# Patient Record
Sex: Female | Born: 1945 | Race: Black or African American | Hispanic: No | Marital: Married | Smoking: Never smoker
Health system: Southern US, Community
[De-identification: ages and names within clinical notes are randomized; demographics above are authoritative.]

## PROBLEM LIST (undated history)

## (undated) DIAGNOSIS — K219 Gastro-esophageal reflux disease without esophagitis: Secondary | ICD-10-CM

## (undated) DIAGNOSIS — E785 Hyperlipidemia, unspecified: Secondary | ICD-10-CM

## (undated) DIAGNOSIS — M1990 Unspecified osteoarthritis, unspecified site: Secondary | ICD-10-CM

## (undated) DIAGNOSIS — F32A Depression, unspecified: Secondary | ICD-10-CM

## (undated) DIAGNOSIS — N189 Chronic kidney disease, unspecified: Secondary | ICD-10-CM

## (undated) DIAGNOSIS — I10 Essential (primary) hypertension: Secondary | ICD-10-CM

## (undated) DIAGNOSIS — H9190 Unspecified hearing loss, unspecified ear: Secondary | ICD-10-CM

## (undated) DIAGNOSIS — H1130 Conjunctival hemorrhage, unspecified eye: Secondary | ICD-10-CM

## (undated) DIAGNOSIS — F419 Anxiety disorder, unspecified: Secondary | ICD-10-CM

## (undated) HISTORY — DX: Unspecified osteoarthritis, unspecified site: M19.90

## (undated) HISTORY — DX: Depression, unspecified: F32.A

## (undated) HISTORY — DX: Hyperlipidemia, unspecified: E78.5

## (undated) HISTORY — PX: ABDOMINAL HYSTERECTOMY: SHX81

## (undated) HISTORY — DX: Chronic kidney disease, unspecified: N18.9

## (undated) HISTORY — DX: Anxiety disorder, unspecified: F41.9

## (undated) HISTORY — DX: Gastro-esophageal reflux disease without esophagitis: K21.9

## (undated) HISTORY — DX: Essential (primary) hypertension: I10

## (undated) HISTORY — DX: Conjunctival hemorrhage, unspecified eye: H11.30

---

## 2005-05-19 ENCOUNTER — Ambulatory Visit: Payer: Self-pay | Admitting: Cardiology

## 2005-05-26 ENCOUNTER — Ambulatory Visit: Payer: Self-pay | Admitting: Cardiology

## 2012-06-03 DIAGNOSIS — R079 Chest pain, unspecified: Secondary | ICD-10-CM

## 2016-01-18 DIAGNOSIS — M542 Cervicalgia: Secondary | ICD-10-CM | POA: Diagnosis not present

## 2016-01-18 DIAGNOSIS — I10 Essential (primary) hypertension: Secondary | ICD-10-CM | POA: Diagnosis not present

## 2016-01-18 DIAGNOSIS — M1990 Unspecified osteoarthritis, unspecified site: Secondary | ICD-10-CM | POA: Diagnosis not present

## 2016-01-18 DIAGNOSIS — J069 Acute upper respiratory infection, unspecified: Secondary | ICD-10-CM | POA: Diagnosis not present

## 2016-01-18 DIAGNOSIS — Z299 Encounter for prophylactic measures, unspecified: Secondary | ICD-10-CM | POA: Diagnosis not present

## 2016-01-18 DIAGNOSIS — Z6826 Body mass index (BMI) 26.0-26.9, adult: Secondary | ICD-10-CM | POA: Diagnosis not present

## 2016-02-19 DIAGNOSIS — M159 Polyosteoarthritis, unspecified: Secondary | ICD-10-CM | POA: Diagnosis not present

## 2016-02-19 DIAGNOSIS — E7800 Pure hypercholesterolemia, unspecified: Secondary | ICD-10-CM | POA: Diagnosis not present

## 2016-02-19 DIAGNOSIS — I10 Essential (primary) hypertension: Secondary | ICD-10-CM | POA: Diagnosis not present

## 2016-03-24 DIAGNOSIS — I10 Essential (primary) hypertension: Secondary | ICD-10-CM | POA: Diagnosis not present

## 2016-03-24 DIAGNOSIS — M159 Polyosteoarthritis, unspecified: Secondary | ICD-10-CM | POA: Diagnosis not present

## 2016-03-24 DIAGNOSIS — E7800 Pure hypercholesterolemia, unspecified: Secondary | ICD-10-CM | POA: Diagnosis not present

## 2016-07-08 DIAGNOSIS — E663 Overweight: Secondary | ICD-10-CM | POA: Diagnosis not present

## 2016-07-08 DIAGNOSIS — Z299 Encounter for prophylactic measures, unspecified: Secondary | ICD-10-CM | POA: Diagnosis not present

## 2016-07-08 DIAGNOSIS — Z1389 Encounter for screening for other disorder: Secondary | ICD-10-CM | POA: Diagnosis not present

## 2016-07-08 DIAGNOSIS — Z1211 Encounter for screening for malignant neoplasm of colon: Secondary | ICD-10-CM | POA: Diagnosis not present

## 2016-07-08 DIAGNOSIS — Z7189 Other specified counseling: Secondary | ICD-10-CM | POA: Diagnosis not present

## 2016-07-08 DIAGNOSIS — Z0000 Encounter for general adult medical examination without abnormal findings: Secondary | ICD-10-CM | POA: Diagnosis not present

## 2016-07-10 DIAGNOSIS — Z79899 Other long term (current) drug therapy: Secondary | ICD-10-CM | POA: Diagnosis not present

## 2016-07-10 DIAGNOSIS — E7800 Pure hypercholesterolemia, unspecified: Secondary | ICD-10-CM | POA: Diagnosis not present

## 2016-07-10 DIAGNOSIS — R5383 Other fatigue: Secondary | ICD-10-CM | POA: Diagnosis not present

## 2016-07-24 DIAGNOSIS — I10 Essential (primary) hypertension: Secondary | ICD-10-CM | POA: Diagnosis not present

## 2016-07-24 DIAGNOSIS — E7800 Pure hypercholesterolemia, unspecified: Secondary | ICD-10-CM | POA: Diagnosis not present

## 2016-07-24 DIAGNOSIS — M159 Polyosteoarthritis, unspecified: Secondary | ICD-10-CM | POA: Diagnosis not present

## 2016-07-29 DIAGNOSIS — Z1231 Encounter for screening mammogram for malignant neoplasm of breast: Secondary | ICD-10-CM | POA: Diagnosis not present

## 2016-08-15 DIAGNOSIS — M159 Polyosteoarthritis, unspecified: Secondary | ICD-10-CM | POA: Diagnosis not present

## 2016-08-15 DIAGNOSIS — E7800 Pure hypercholesterolemia, unspecified: Secondary | ICD-10-CM | POA: Diagnosis not present

## 2016-08-15 DIAGNOSIS — I10 Essential (primary) hypertension: Secondary | ICD-10-CM | POA: Diagnosis not present

## 2016-11-20 DIAGNOSIS — I10 Essential (primary) hypertension: Secondary | ICD-10-CM | POA: Diagnosis not present

## 2016-11-20 DIAGNOSIS — E7800 Pure hypercholesterolemia, unspecified: Secondary | ICD-10-CM | POA: Diagnosis not present

## 2016-11-20 DIAGNOSIS — M159 Polyosteoarthritis, unspecified: Secondary | ICD-10-CM | POA: Diagnosis not present

## 2016-12-24 DIAGNOSIS — I10 Essential (primary) hypertension: Secondary | ICD-10-CM | POA: Diagnosis not present

## 2016-12-24 DIAGNOSIS — M159 Polyosteoarthritis, unspecified: Secondary | ICD-10-CM | POA: Diagnosis not present

## 2016-12-24 DIAGNOSIS — E7800 Pure hypercholesterolemia, unspecified: Secondary | ICD-10-CM | POA: Diagnosis not present

## 2017-01-22 DIAGNOSIS — E7800 Pure hypercholesterolemia, unspecified: Secondary | ICD-10-CM | POA: Diagnosis not present

## 2017-01-22 DIAGNOSIS — I10 Essential (primary) hypertension: Secondary | ICD-10-CM | POA: Diagnosis not present

## 2017-01-22 DIAGNOSIS — M159 Polyosteoarthritis, unspecified: Secondary | ICD-10-CM | POA: Diagnosis not present

## 2017-03-13 DIAGNOSIS — F419 Anxiety disorder, unspecified: Secondary | ICD-10-CM | POA: Diagnosis not present

## 2017-03-13 DIAGNOSIS — Z299 Encounter for prophylactic measures, unspecified: Secondary | ICD-10-CM | POA: Diagnosis not present

## 2017-03-13 DIAGNOSIS — I10 Essential (primary) hypertension: Secondary | ICD-10-CM | POA: Diagnosis not present

## 2017-03-13 DIAGNOSIS — M79605 Pain in left leg: Secondary | ICD-10-CM | POA: Diagnosis not present

## 2017-03-13 DIAGNOSIS — Z6825 Body mass index (BMI) 25.0-25.9, adult: Secondary | ICD-10-CM | POA: Diagnosis not present

## 2017-03-13 DIAGNOSIS — M1710 Unilateral primary osteoarthritis, unspecified knee: Secondary | ICD-10-CM | POA: Diagnosis not present

## 2017-03-13 DIAGNOSIS — E7800 Pure hypercholesterolemia, unspecified: Secondary | ICD-10-CM | POA: Diagnosis not present

## 2017-03-13 DIAGNOSIS — Z713 Dietary counseling and surveillance: Secondary | ICD-10-CM | POA: Diagnosis not present

## 2017-03-31 DIAGNOSIS — M159 Polyosteoarthritis, unspecified: Secondary | ICD-10-CM | POA: Diagnosis not present

## 2017-03-31 DIAGNOSIS — E7800 Pure hypercholesterolemia, unspecified: Secondary | ICD-10-CM | POA: Diagnosis not present

## 2017-03-31 DIAGNOSIS — I10 Essential (primary) hypertension: Secondary | ICD-10-CM | POA: Diagnosis not present

## 2017-06-25 DIAGNOSIS — E7800 Pure hypercholesterolemia, unspecified: Secondary | ICD-10-CM | POA: Diagnosis not present

## 2017-06-25 DIAGNOSIS — M159 Polyosteoarthritis, unspecified: Secondary | ICD-10-CM | POA: Diagnosis not present

## 2017-06-25 DIAGNOSIS — I10 Essential (primary) hypertension: Secondary | ICD-10-CM | POA: Diagnosis not present

## 2017-07-09 DIAGNOSIS — Z79899 Other long term (current) drug therapy: Secondary | ICD-10-CM | POA: Diagnosis not present

## 2017-07-09 DIAGNOSIS — Z7189 Other specified counseling: Secondary | ICD-10-CM | POA: Diagnosis not present

## 2017-07-09 DIAGNOSIS — Z0000 Encounter for general adult medical examination without abnormal findings: Secondary | ICD-10-CM | POA: Diagnosis not present

## 2017-07-09 DIAGNOSIS — Z6825 Body mass index (BMI) 25.0-25.9, adult: Secondary | ICD-10-CM | POA: Diagnosis not present

## 2017-07-09 DIAGNOSIS — Z299 Encounter for prophylactic measures, unspecified: Secondary | ICD-10-CM | POA: Diagnosis not present

## 2017-07-09 DIAGNOSIS — R5383 Other fatigue: Secondary | ICD-10-CM | POA: Diagnosis not present

## 2017-07-09 DIAGNOSIS — Z1211 Encounter for screening for malignant neoplasm of colon: Secondary | ICD-10-CM | POA: Diagnosis not present

## 2017-07-09 DIAGNOSIS — Z789 Other specified health status: Secondary | ICD-10-CM | POA: Diagnosis not present

## 2017-07-09 DIAGNOSIS — Z1331 Encounter for screening for depression: Secondary | ICD-10-CM | POA: Diagnosis not present

## 2017-07-09 DIAGNOSIS — Z1339 Encounter for screening examination for other mental health and behavioral disorders: Secondary | ICD-10-CM | POA: Diagnosis not present

## 2017-07-09 DIAGNOSIS — E7800 Pure hypercholesterolemia, unspecified: Secondary | ICD-10-CM | POA: Diagnosis not present

## 2017-07-09 DIAGNOSIS — E2839 Other primary ovarian failure: Secondary | ICD-10-CM | POA: Diagnosis not present

## 2017-07-21 DIAGNOSIS — E2839 Other primary ovarian failure: Secondary | ICD-10-CM | POA: Diagnosis not present

## 2017-08-11 DIAGNOSIS — E7800 Pure hypercholesterolemia, unspecified: Secondary | ICD-10-CM | POA: Diagnosis not present

## 2017-08-11 DIAGNOSIS — I10 Essential (primary) hypertension: Secondary | ICD-10-CM | POA: Diagnosis not present

## 2017-08-11 DIAGNOSIS — M159 Polyosteoarthritis, unspecified: Secondary | ICD-10-CM | POA: Diagnosis not present

## 2017-09-15 DIAGNOSIS — M159 Polyosteoarthritis, unspecified: Secondary | ICD-10-CM | POA: Diagnosis not present

## 2017-09-15 DIAGNOSIS — I10 Essential (primary) hypertension: Secondary | ICD-10-CM | POA: Diagnosis not present

## 2017-09-15 DIAGNOSIS — E7800 Pure hypercholesterolemia, unspecified: Secondary | ICD-10-CM | POA: Diagnosis not present

## 2017-10-16 DIAGNOSIS — I10 Essential (primary) hypertension: Secondary | ICD-10-CM | POA: Diagnosis not present

## 2017-10-16 DIAGNOSIS — E7800 Pure hypercholesterolemia, unspecified: Secondary | ICD-10-CM | POA: Diagnosis not present

## 2017-10-16 DIAGNOSIS — M159 Polyosteoarthritis, unspecified: Secondary | ICD-10-CM | POA: Diagnosis not present

## 2017-11-25 DIAGNOSIS — I10 Essential (primary) hypertension: Secondary | ICD-10-CM | POA: Diagnosis not present

## 2017-11-25 DIAGNOSIS — E7800 Pure hypercholesterolemia, unspecified: Secondary | ICD-10-CM | POA: Diagnosis not present

## 2017-11-25 DIAGNOSIS — M159 Polyosteoarthritis, unspecified: Secondary | ICD-10-CM | POA: Diagnosis not present

## 2017-12-29 DIAGNOSIS — E7800 Pure hypercholesterolemia, unspecified: Secondary | ICD-10-CM | POA: Diagnosis not present

## 2017-12-29 DIAGNOSIS — I10 Essential (primary) hypertension: Secondary | ICD-10-CM | POA: Diagnosis not present

## 2017-12-29 DIAGNOSIS — M159 Polyosteoarthritis, unspecified: Secondary | ICD-10-CM | POA: Diagnosis not present

## 2018-02-10 DIAGNOSIS — M159 Polyosteoarthritis, unspecified: Secondary | ICD-10-CM | POA: Diagnosis not present

## 2018-02-10 DIAGNOSIS — E7800 Pure hypercholesterolemia, unspecified: Secondary | ICD-10-CM | POA: Diagnosis not present

## 2018-02-10 DIAGNOSIS — I10 Essential (primary) hypertension: Secondary | ICD-10-CM | POA: Diagnosis not present

## 2018-03-10 DIAGNOSIS — I10 Essential (primary) hypertension: Secondary | ICD-10-CM | POA: Diagnosis not present

## 2018-03-10 DIAGNOSIS — Z299 Encounter for prophylactic measures, unspecified: Secondary | ICD-10-CM | POA: Diagnosis not present

## 2018-03-10 DIAGNOSIS — E7800 Pure hypercholesterolemia, unspecified: Secondary | ICD-10-CM | POA: Diagnosis not present

## 2018-03-10 DIAGNOSIS — Z6824 Body mass index (BMI) 24.0-24.9, adult: Secondary | ICD-10-CM | POA: Diagnosis not present

## 2018-03-10 DIAGNOSIS — M79606 Pain in leg, unspecified: Secondary | ICD-10-CM | POA: Diagnosis not present

## 2018-03-16 DIAGNOSIS — E7800 Pure hypercholesterolemia, unspecified: Secondary | ICD-10-CM | POA: Diagnosis not present

## 2018-03-16 DIAGNOSIS — M159 Polyosteoarthritis, unspecified: Secondary | ICD-10-CM | POA: Diagnosis not present

## 2018-03-16 DIAGNOSIS — I10 Essential (primary) hypertension: Secondary | ICD-10-CM | POA: Diagnosis not present

## 2018-04-05 DIAGNOSIS — I10 Essential (primary) hypertension: Secondary | ICD-10-CM | POA: Diagnosis not present

## 2018-04-05 DIAGNOSIS — R079 Chest pain, unspecified: Secondary | ICD-10-CM | POA: Diagnosis not present

## 2018-04-05 DIAGNOSIS — Z6825 Body mass index (BMI) 25.0-25.9, adult: Secondary | ICD-10-CM | POA: Diagnosis not present

## 2018-04-05 DIAGNOSIS — I739 Peripheral vascular disease, unspecified: Secondary | ICD-10-CM | POA: Diagnosis not present

## 2018-04-05 DIAGNOSIS — Z299 Encounter for prophylactic measures, unspecified: Secondary | ICD-10-CM | POA: Diagnosis not present

## 2018-04-05 DIAGNOSIS — E7800 Pure hypercholesterolemia, unspecified: Secondary | ICD-10-CM | POA: Diagnosis not present

## 2018-04-16 DIAGNOSIS — I10 Essential (primary) hypertension: Secondary | ICD-10-CM | POA: Diagnosis not present

## 2018-04-16 DIAGNOSIS — E7800 Pure hypercholesterolemia, unspecified: Secondary | ICD-10-CM | POA: Diagnosis not present

## 2018-04-16 DIAGNOSIS — M159 Polyosteoarthritis, unspecified: Secondary | ICD-10-CM | POA: Diagnosis not present

## 2018-04-19 DIAGNOSIS — R079 Chest pain, unspecified: Secondary | ICD-10-CM | POA: Diagnosis not present

## 2018-04-19 DIAGNOSIS — I70202 Unspecified atherosclerosis of native arteries of extremities, left leg: Secondary | ICD-10-CM | POA: Diagnosis not present

## 2018-04-19 DIAGNOSIS — I70211 Atherosclerosis of native arteries of extremities with intermittent claudication, right leg: Secondary | ICD-10-CM | POA: Diagnosis not present

## 2018-06-03 DIAGNOSIS — E7800 Pure hypercholesterolemia, unspecified: Secondary | ICD-10-CM | POA: Diagnosis not present

## 2018-06-03 DIAGNOSIS — I10 Essential (primary) hypertension: Secondary | ICD-10-CM | POA: Diagnosis not present

## 2018-06-03 DIAGNOSIS — M159 Polyosteoarthritis, unspecified: Secondary | ICD-10-CM | POA: Diagnosis not present

## 2018-06-30 DIAGNOSIS — M159 Polyosteoarthritis, unspecified: Secondary | ICD-10-CM | POA: Diagnosis not present

## 2018-06-30 DIAGNOSIS — E7800 Pure hypercholesterolemia, unspecified: Secondary | ICD-10-CM | POA: Diagnosis not present

## 2018-06-30 DIAGNOSIS — I10 Essential (primary) hypertension: Secondary | ICD-10-CM | POA: Diagnosis not present

## 2018-07-12 DIAGNOSIS — Z1211 Encounter for screening for malignant neoplasm of colon: Secondary | ICD-10-CM | POA: Diagnosis not present

## 2018-07-12 DIAGNOSIS — Z6825 Body mass index (BMI) 25.0-25.9, adult: Secondary | ICD-10-CM | POA: Diagnosis not present

## 2018-07-12 DIAGNOSIS — Z7189 Other specified counseling: Secondary | ICD-10-CM | POA: Diagnosis not present

## 2018-07-12 DIAGNOSIS — I10 Essential (primary) hypertension: Secondary | ICD-10-CM | POA: Diagnosis not present

## 2018-07-12 DIAGNOSIS — Z299 Encounter for prophylactic measures, unspecified: Secondary | ICD-10-CM | POA: Diagnosis not present

## 2018-07-12 DIAGNOSIS — Z1339 Encounter for screening examination for other mental health and behavioral disorders: Secondary | ICD-10-CM | POA: Diagnosis not present

## 2018-07-12 DIAGNOSIS — E7800 Pure hypercholesterolemia, unspecified: Secondary | ICD-10-CM | POA: Diagnosis not present

## 2018-07-12 DIAGNOSIS — Z0000 Encounter for general adult medical examination without abnormal findings: Secondary | ICD-10-CM | POA: Diagnosis not present

## 2018-07-12 DIAGNOSIS — Z1331 Encounter for screening for depression: Secondary | ICD-10-CM | POA: Diagnosis not present

## 2018-07-12 DIAGNOSIS — R5383 Other fatigue: Secondary | ICD-10-CM | POA: Diagnosis not present

## 2018-07-13 DIAGNOSIS — E7800 Pure hypercholesterolemia, unspecified: Secondary | ICD-10-CM | POA: Diagnosis not present

## 2018-07-13 DIAGNOSIS — Z79899 Other long term (current) drug therapy: Secondary | ICD-10-CM | POA: Diagnosis not present

## 2018-07-13 DIAGNOSIS — R5383 Other fatigue: Secondary | ICD-10-CM | POA: Diagnosis not present

## 2018-07-29 DIAGNOSIS — E7800 Pure hypercholesterolemia, unspecified: Secondary | ICD-10-CM | POA: Diagnosis not present

## 2018-07-29 DIAGNOSIS — I10 Essential (primary) hypertension: Secondary | ICD-10-CM | POA: Diagnosis not present

## 2018-07-29 DIAGNOSIS — M159 Polyosteoarthritis, unspecified: Secondary | ICD-10-CM | POA: Diagnosis not present

## 2018-08-27 DIAGNOSIS — H538 Other visual disturbances: Secondary | ICD-10-CM | POA: Diagnosis not present

## 2018-09-07 DIAGNOSIS — M159 Polyosteoarthritis, unspecified: Secondary | ICD-10-CM | POA: Diagnosis not present

## 2018-09-07 DIAGNOSIS — I10 Essential (primary) hypertension: Secondary | ICD-10-CM | POA: Diagnosis not present

## 2018-09-07 DIAGNOSIS — E7800 Pure hypercholesterolemia, unspecified: Secondary | ICD-10-CM | POA: Diagnosis not present

## 2018-10-06 DIAGNOSIS — E7800 Pure hypercholesterolemia, unspecified: Secondary | ICD-10-CM | POA: Diagnosis not present

## 2018-10-06 DIAGNOSIS — M159 Polyosteoarthritis, unspecified: Secondary | ICD-10-CM | POA: Diagnosis not present

## 2018-10-06 DIAGNOSIS — I10 Essential (primary) hypertension: Secondary | ICD-10-CM | POA: Diagnosis not present

## 2018-10-19 DIAGNOSIS — Z713 Dietary counseling and surveillance: Secondary | ICD-10-CM | POA: Diagnosis not present

## 2018-10-19 DIAGNOSIS — Z299 Encounter for prophylactic measures, unspecified: Secondary | ICD-10-CM | POA: Diagnosis not present

## 2018-10-19 DIAGNOSIS — Z87891 Personal history of nicotine dependence: Secondary | ICD-10-CM | POA: Diagnosis not present

## 2018-10-19 DIAGNOSIS — I10 Essential (primary) hypertension: Secondary | ICD-10-CM | POA: Diagnosis not present

## 2018-10-19 DIAGNOSIS — J209 Acute bronchitis, unspecified: Secondary | ICD-10-CM | POA: Diagnosis not present

## 2018-11-11 DIAGNOSIS — E7800 Pure hypercholesterolemia, unspecified: Secondary | ICD-10-CM | POA: Diagnosis not present

## 2018-11-11 DIAGNOSIS — I10 Essential (primary) hypertension: Secondary | ICD-10-CM | POA: Diagnosis not present

## 2018-11-11 DIAGNOSIS — M159 Polyosteoarthritis, unspecified: Secondary | ICD-10-CM | POA: Diagnosis not present

## 2018-12-13 DIAGNOSIS — I10 Essential (primary) hypertension: Secondary | ICD-10-CM | POA: Diagnosis not present

## 2018-12-13 DIAGNOSIS — M159 Polyosteoarthritis, unspecified: Secondary | ICD-10-CM | POA: Diagnosis not present

## 2018-12-13 DIAGNOSIS — E7800 Pure hypercholesterolemia, unspecified: Secondary | ICD-10-CM | POA: Diagnosis not present

## 2019-01-18 DIAGNOSIS — I739 Peripheral vascular disease, unspecified: Secondary | ICD-10-CM | POA: Diagnosis not present

## 2019-01-18 DIAGNOSIS — I10 Essential (primary) hypertension: Secondary | ICD-10-CM | POA: Diagnosis not present

## 2019-01-18 DIAGNOSIS — Z299 Encounter for prophylactic measures, unspecified: Secondary | ICD-10-CM | POA: Diagnosis not present

## 2019-01-18 DIAGNOSIS — Z713 Dietary counseling and surveillance: Secondary | ICD-10-CM | POA: Diagnosis not present

## 2019-01-18 DIAGNOSIS — Z6825 Body mass index (BMI) 25.0-25.9, adult: Secondary | ICD-10-CM | POA: Diagnosis not present

## 2019-01-25 DIAGNOSIS — I10 Essential (primary) hypertension: Secondary | ICD-10-CM | POA: Diagnosis not present

## 2019-01-25 DIAGNOSIS — R05 Cough: Secondary | ICD-10-CM | POA: Diagnosis not present

## 2019-01-25 DIAGNOSIS — Z299 Encounter for prophylactic measures, unspecified: Secondary | ICD-10-CM | POA: Diagnosis not present

## 2019-01-25 DIAGNOSIS — I739 Peripheral vascular disease, unspecified: Secondary | ICD-10-CM | POA: Diagnosis not present

## 2019-01-25 DIAGNOSIS — Z6825 Body mass index (BMI) 25.0-25.9, adult: Secondary | ICD-10-CM | POA: Diagnosis not present

## 2019-03-08 DIAGNOSIS — M159 Polyosteoarthritis, unspecified: Secondary | ICD-10-CM | POA: Diagnosis not present

## 2019-03-08 DIAGNOSIS — E7800 Pure hypercholesterolemia, unspecified: Secondary | ICD-10-CM | POA: Diagnosis not present

## 2019-03-08 DIAGNOSIS — I10 Essential (primary) hypertension: Secondary | ICD-10-CM | POA: Diagnosis not present

## 2019-03-15 DIAGNOSIS — E7800 Pure hypercholesterolemia, unspecified: Secondary | ICD-10-CM | POA: Diagnosis not present

## 2019-03-15 DIAGNOSIS — F419 Anxiety disorder, unspecified: Secondary | ICD-10-CM | POA: Diagnosis not present

## 2019-03-15 DIAGNOSIS — F329 Major depressive disorder, single episode, unspecified: Secondary | ICD-10-CM | POA: Diagnosis not present

## 2019-03-15 DIAGNOSIS — Z299 Encounter for prophylactic measures, unspecified: Secondary | ICD-10-CM | POA: Diagnosis not present

## 2019-03-15 DIAGNOSIS — I10 Essential (primary) hypertension: Secondary | ICD-10-CM | POA: Diagnosis not present

## 2019-05-12 DIAGNOSIS — E7800 Pure hypercholesterolemia, unspecified: Secondary | ICD-10-CM | POA: Diagnosis not present

## 2019-05-12 DIAGNOSIS — I10 Essential (primary) hypertension: Secondary | ICD-10-CM | POA: Diagnosis not present

## 2019-05-12 DIAGNOSIS — M159 Polyosteoarthritis, unspecified: Secondary | ICD-10-CM | POA: Diagnosis not present

## 2019-06-23 DIAGNOSIS — R5383 Other fatigue: Secondary | ICD-10-CM | POA: Diagnosis not present

## 2019-06-23 DIAGNOSIS — Z79899 Other long term (current) drug therapy: Secondary | ICD-10-CM | POA: Diagnosis not present

## 2019-06-23 DIAGNOSIS — E7800 Pure hypercholesterolemia, unspecified: Secondary | ICD-10-CM | POA: Diagnosis not present

## 2019-06-24 DIAGNOSIS — M159 Polyosteoarthritis, unspecified: Secondary | ICD-10-CM | POA: Diagnosis not present

## 2019-06-24 DIAGNOSIS — I10 Essential (primary) hypertension: Secondary | ICD-10-CM | POA: Diagnosis not present

## 2019-06-24 DIAGNOSIS — E7800 Pure hypercholesterolemia, unspecified: Secondary | ICD-10-CM | POA: Diagnosis not present

## 2019-07-22 DIAGNOSIS — E7800 Pure hypercholesterolemia, unspecified: Secondary | ICD-10-CM | POA: Diagnosis not present

## 2019-07-22 DIAGNOSIS — I10 Essential (primary) hypertension: Secondary | ICD-10-CM | POA: Diagnosis not present

## 2019-07-22 DIAGNOSIS — M159 Polyosteoarthritis, unspecified: Secondary | ICD-10-CM | POA: Diagnosis not present

## 2019-08-30 DIAGNOSIS — E7800 Pure hypercholesterolemia, unspecified: Secondary | ICD-10-CM | POA: Diagnosis not present

## 2019-08-30 DIAGNOSIS — I10 Essential (primary) hypertension: Secondary | ICD-10-CM | POA: Diagnosis not present

## 2019-08-30 DIAGNOSIS — M159 Polyosteoarthritis, unspecified: Secondary | ICD-10-CM | POA: Diagnosis not present

## 2019-09-13 DIAGNOSIS — Z23 Encounter for immunization: Secondary | ICD-10-CM | POA: Diagnosis not present

## 2019-09-27 DIAGNOSIS — Z299 Encounter for prophylactic measures, unspecified: Secondary | ICD-10-CM | POA: Diagnosis not present

## 2019-09-27 DIAGNOSIS — E7800 Pure hypercholesterolemia, unspecified: Secondary | ICD-10-CM | POA: Diagnosis not present

## 2019-09-27 DIAGNOSIS — I10 Essential (primary) hypertension: Secondary | ICD-10-CM | POA: Diagnosis not present

## 2019-09-27 DIAGNOSIS — M542 Cervicalgia: Secondary | ICD-10-CM | POA: Diagnosis not present

## 2019-10-04 DIAGNOSIS — I10 Essential (primary) hypertension: Secondary | ICD-10-CM | POA: Diagnosis not present

## 2019-10-04 DIAGNOSIS — E7800 Pure hypercholesterolemia, unspecified: Secondary | ICD-10-CM | POA: Diagnosis not present

## 2019-10-04 DIAGNOSIS — M159 Polyosteoarthritis, unspecified: Secondary | ICD-10-CM | POA: Diagnosis not present

## 2019-10-11 DIAGNOSIS — Z23 Encounter for immunization: Secondary | ICD-10-CM | POA: Diagnosis not present

## 2019-11-04 DIAGNOSIS — E7800 Pure hypercholesterolemia, unspecified: Secondary | ICD-10-CM | POA: Diagnosis not present

## 2019-11-04 DIAGNOSIS — I10 Essential (primary) hypertension: Secondary | ICD-10-CM | POA: Diagnosis not present

## 2019-11-04 DIAGNOSIS — M159 Polyosteoarthritis, unspecified: Secondary | ICD-10-CM | POA: Diagnosis not present

## 2019-11-25 DIAGNOSIS — Z299 Encounter for prophylactic measures, unspecified: Secondary | ICD-10-CM | POA: Diagnosis not present

## 2019-11-25 DIAGNOSIS — M542 Cervicalgia: Secondary | ICD-10-CM | POA: Diagnosis not present

## 2019-11-25 DIAGNOSIS — I10 Essential (primary) hypertension: Secondary | ICD-10-CM | POA: Diagnosis not present

## 2019-12-11 DIAGNOSIS — M159 Polyosteoarthritis, unspecified: Secondary | ICD-10-CM | POA: Diagnosis not present

## 2019-12-11 DIAGNOSIS — I10 Essential (primary) hypertension: Secondary | ICD-10-CM | POA: Diagnosis not present

## 2019-12-11 DIAGNOSIS — E7800 Pure hypercholesterolemia, unspecified: Secondary | ICD-10-CM | POA: Diagnosis not present

## 2020-01-11 DIAGNOSIS — E7800 Pure hypercholesterolemia, unspecified: Secondary | ICD-10-CM | POA: Diagnosis not present

## 2020-01-11 DIAGNOSIS — I10 Essential (primary) hypertension: Secondary | ICD-10-CM | POA: Diagnosis not present

## 2020-01-11 DIAGNOSIS — M159 Polyosteoarthritis, unspecified: Secondary | ICD-10-CM | POA: Diagnosis not present

## 2020-03-07 DIAGNOSIS — F419 Anxiety disorder, unspecified: Secondary | ICD-10-CM | POA: Diagnosis not present

## 2020-03-07 DIAGNOSIS — Z299 Encounter for prophylactic measures, unspecified: Secondary | ICD-10-CM | POA: Diagnosis not present

## 2020-03-07 DIAGNOSIS — I10 Essential (primary) hypertension: Secondary | ICD-10-CM | POA: Diagnosis not present

## 2020-04-12 DIAGNOSIS — E7800 Pure hypercholesterolemia, unspecified: Secondary | ICD-10-CM | POA: Diagnosis not present

## 2020-04-12 DIAGNOSIS — I10 Essential (primary) hypertension: Secondary | ICD-10-CM | POA: Diagnosis not present

## 2020-04-12 DIAGNOSIS — M159 Polyosteoarthritis, unspecified: Secondary | ICD-10-CM | POA: Diagnosis not present

## 2020-05-11 DIAGNOSIS — I10 Essential (primary) hypertension: Secondary | ICD-10-CM | POA: Diagnosis not present

## 2020-05-11 DIAGNOSIS — E7800 Pure hypercholesterolemia, unspecified: Secondary | ICD-10-CM | POA: Diagnosis not present

## 2020-05-11 DIAGNOSIS — M159 Polyosteoarthritis, unspecified: Secondary | ICD-10-CM | POA: Diagnosis not present

## 2020-06-12 DIAGNOSIS — M159 Polyosteoarthritis, unspecified: Secondary | ICD-10-CM | POA: Diagnosis not present

## 2020-06-12 DIAGNOSIS — I10 Essential (primary) hypertension: Secondary | ICD-10-CM | POA: Diagnosis not present

## 2020-06-12 DIAGNOSIS — E7800 Pure hypercholesterolemia, unspecified: Secondary | ICD-10-CM | POA: Diagnosis not present

## 2020-07-03 DIAGNOSIS — Z7189 Other specified counseling: Secondary | ICD-10-CM | POA: Diagnosis not present

## 2020-07-03 DIAGNOSIS — R5383 Other fatigue: Secondary | ICD-10-CM | POA: Diagnosis not present

## 2020-07-03 DIAGNOSIS — Z1211 Encounter for screening for malignant neoplasm of colon: Secondary | ICD-10-CM | POA: Diagnosis not present

## 2020-07-03 DIAGNOSIS — Z0000 Encounter for general adult medical examination without abnormal findings: Secondary | ICD-10-CM | POA: Diagnosis not present

## 2020-07-03 DIAGNOSIS — Z1331 Encounter for screening for depression: Secondary | ICD-10-CM | POA: Diagnosis not present

## 2020-07-03 DIAGNOSIS — Z6824 Body mass index (BMI) 24.0-24.9, adult: Secondary | ICD-10-CM | POA: Diagnosis not present

## 2020-07-03 DIAGNOSIS — Z79899 Other long term (current) drug therapy: Secondary | ICD-10-CM | POA: Diagnosis not present

## 2020-07-03 DIAGNOSIS — E7800 Pure hypercholesterolemia, unspecified: Secondary | ICD-10-CM | POA: Diagnosis not present

## 2020-07-03 DIAGNOSIS — Z1339 Encounter for screening examination for other mental health and behavioral disorders: Secondary | ICD-10-CM | POA: Diagnosis not present

## 2020-07-03 DIAGNOSIS — I10 Essential (primary) hypertension: Secondary | ICD-10-CM | POA: Diagnosis not present

## 2020-07-03 DIAGNOSIS — Z299 Encounter for prophylactic measures, unspecified: Secondary | ICD-10-CM | POA: Diagnosis not present

## 2020-07-12 DIAGNOSIS — I10 Essential (primary) hypertension: Secondary | ICD-10-CM | POA: Diagnosis not present

## 2020-07-12 DIAGNOSIS — E7800 Pure hypercholesterolemia, unspecified: Secondary | ICD-10-CM | POA: Diagnosis not present

## 2020-07-12 DIAGNOSIS — M159 Polyosteoarthritis, unspecified: Secondary | ICD-10-CM | POA: Diagnosis not present

## 2020-09-05 DIAGNOSIS — I739 Peripheral vascular disease, unspecified: Secondary | ICD-10-CM | POA: Diagnosis not present

## 2020-09-05 DIAGNOSIS — I10 Essential (primary) hypertension: Secondary | ICD-10-CM | POA: Diagnosis not present

## 2020-09-05 DIAGNOSIS — Z299 Encounter for prophylactic measures, unspecified: Secondary | ICD-10-CM | POA: Diagnosis not present

## 2020-09-05 DIAGNOSIS — K219 Gastro-esophageal reflux disease without esophagitis: Secondary | ICD-10-CM | POA: Diagnosis not present

## 2020-09-05 DIAGNOSIS — D692 Other nonthrombocytopenic purpura: Secondary | ICD-10-CM | POA: Diagnosis not present

## 2020-12-20 DIAGNOSIS — Z299 Encounter for prophylactic measures, unspecified: Secondary | ICD-10-CM | POA: Diagnosis not present

## 2020-12-20 DIAGNOSIS — K219 Gastro-esophageal reflux disease without esophagitis: Secondary | ICD-10-CM | POA: Diagnosis not present

## 2020-12-20 DIAGNOSIS — E7800 Pure hypercholesterolemia, unspecified: Secondary | ICD-10-CM | POA: Diagnosis not present

## 2020-12-20 DIAGNOSIS — Z87891 Personal history of nicotine dependence: Secondary | ICD-10-CM | POA: Diagnosis not present

## 2020-12-20 DIAGNOSIS — I739 Peripheral vascular disease, unspecified: Secondary | ICD-10-CM | POA: Diagnosis not present

## 2020-12-20 DIAGNOSIS — I10 Essential (primary) hypertension: Secondary | ICD-10-CM | POA: Diagnosis not present

## 2021-04-25 DIAGNOSIS — I10 Essential (primary) hypertension: Secondary | ICD-10-CM | POA: Diagnosis not present

## 2021-04-25 DIAGNOSIS — Z6825 Body mass index (BMI) 25.0-25.9, adult: Secondary | ICD-10-CM | POA: Diagnosis not present

## 2021-04-25 DIAGNOSIS — E7800 Pure hypercholesterolemia, unspecified: Secondary | ICD-10-CM | POA: Diagnosis not present

## 2021-04-25 DIAGNOSIS — Z299 Encounter for prophylactic measures, unspecified: Secondary | ICD-10-CM | POA: Diagnosis not present

## 2021-05-15 ENCOUNTER — Other Ambulatory Visit: Payer: Self-pay | Admitting: Internal Medicine

## 2021-05-15 DIAGNOSIS — Z139 Encounter for screening, unspecified: Secondary | ICD-10-CM

## 2021-05-21 ENCOUNTER — Inpatient Hospital Stay: Admission: RE | Admit: 2021-05-21 | Payer: Medicare Other | Source: Ambulatory Visit

## 2021-06-24 ENCOUNTER — Ambulatory Visit
Admission: RE | Admit: 2021-06-24 | Discharge: 2021-06-24 | Disposition: A | Discharge status: Home / Self Care | Payer: Medicare Other | Source: Ambulatory Visit | Attending: Internal Medicine | Admitting: Internal Medicine

## 2021-06-24 DIAGNOSIS — Z1231 Encounter for screening mammogram for malignant neoplasm of breast: Secondary | ICD-10-CM | POA: Diagnosis not present

## 2021-06-24 DIAGNOSIS — Z139 Encounter for screening, unspecified: Secondary | ICD-10-CM

## 2021-06-27 ENCOUNTER — Other Ambulatory Visit: Payer: Self-pay | Admitting: Internal Medicine

## 2021-06-27 DIAGNOSIS — Z1339 Encounter for screening examination for other mental health and behavioral disorders: Secondary | ICD-10-CM | POA: Diagnosis not present

## 2021-06-27 DIAGNOSIS — Z1331 Encounter for screening for depression: Secondary | ICD-10-CM | POA: Diagnosis not present

## 2021-06-27 DIAGNOSIS — Z299 Encounter for prophylactic measures, unspecified: Secondary | ICD-10-CM | POA: Diagnosis not present

## 2021-06-27 DIAGNOSIS — E7800 Pure hypercholesterolemia, unspecified: Secondary | ICD-10-CM | POA: Diagnosis not present

## 2021-06-27 DIAGNOSIS — R5383 Other fatigue: Secondary | ICD-10-CM | POA: Diagnosis not present

## 2021-06-27 DIAGNOSIS — Z6824 Body mass index (BMI) 24.0-24.9, adult: Secondary | ICD-10-CM | POA: Diagnosis not present

## 2021-06-27 DIAGNOSIS — Z87891 Personal history of nicotine dependence: Secondary | ICD-10-CM | POA: Diagnosis not present

## 2021-06-27 DIAGNOSIS — Z0000 Encounter for general adult medical examination without abnormal findings: Secondary | ICD-10-CM | POA: Diagnosis not present

## 2021-06-27 DIAGNOSIS — Z7189 Other specified counseling: Secondary | ICD-10-CM | POA: Diagnosis not present

## 2021-06-27 DIAGNOSIS — Z79899 Other long term (current) drug therapy: Secondary | ICD-10-CM | POA: Diagnosis not present

## 2021-06-27 DIAGNOSIS — R928 Other abnormal and inconclusive findings on diagnostic imaging of breast: Secondary | ICD-10-CM

## 2021-06-27 DIAGNOSIS — I10 Essential (primary) hypertension: Secondary | ICD-10-CM | POA: Diagnosis not present

## 2021-08-28 DIAGNOSIS — R928 Other abnormal and inconclusive findings on diagnostic imaging of breast: Secondary | ICD-10-CM | POA: Diagnosis not present

## 2021-08-28 DIAGNOSIS — R922 Inconclusive mammogram: Secondary | ICD-10-CM | POA: Diagnosis not present

## 2021-09-04 ENCOUNTER — Other Ambulatory Visit: Payer: Self-pay | Admitting: Internal Medicine

## 2021-09-04 DIAGNOSIS — R928 Other abnormal and inconclusive findings on diagnostic imaging of breast: Secondary | ICD-10-CM

## 2021-09-30 ENCOUNTER — Other Ambulatory Visit: Payer: Self-pay | Admitting: Internal Medicine

## 2021-09-30 ENCOUNTER — Ambulatory Visit
Admission: RE | Admit: 2021-09-30 | Discharge: 2021-09-30 | Disposition: A | Discharge status: Home / Self Care | Payer: Medicare Other | Source: Ambulatory Visit | Attending: Internal Medicine | Admitting: Internal Medicine

## 2021-09-30 DIAGNOSIS — R928 Other abnormal and inconclusive findings on diagnostic imaging of breast: Secondary | ICD-10-CM

## 2021-10-01 DIAGNOSIS — E7800 Pure hypercholesterolemia, unspecified: Secondary | ICD-10-CM | POA: Diagnosis not present

## 2021-10-01 DIAGNOSIS — Z299 Encounter for prophylactic measures, unspecified: Secondary | ICD-10-CM | POA: Diagnosis not present

## 2021-10-01 DIAGNOSIS — N1831 Chronic kidney disease, stage 3a: Secondary | ICD-10-CM | POA: Diagnosis not present

## 2021-10-01 DIAGNOSIS — I10 Essential (primary) hypertension: Secondary | ICD-10-CM | POA: Diagnosis not present

## 2021-10-01 DIAGNOSIS — Z79899 Other long term (current) drug therapy: Secondary | ICD-10-CM | POA: Diagnosis not present

## 2021-10-02 DIAGNOSIS — Z79899 Other long term (current) drug therapy: Secondary | ICD-10-CM | POA: Diagnosis not present

## 2021-10-02 DIAGNOSIS — E7800 Pure hypercholesterolemia, unspecified: Secondary | ICD-10-CM | POA: Diagnosis not present

## 2022-01-15 DIAGNOSIS — D692 Other nonthrombocytopenic purpura: Secondary | ICD-10-CM | POA: Diagnosis not present

## 2022-01-15 DIAGNOSIS — I10 Essential (primary) hypertension: Secondary | ICD-10-CM | POA: Diagnosis not present

## 2022-01-15 DIAGNOSIS — E7800 Pure hypercholesterolemia, unspecified: Secondary | ICD-10-CM | POA: Diagnosis not present

## 2022-01-15 DIAGNOSIS — Z299 Encounter for prophylactic measures, unspecified: Secondary | ICD-10-CM | POA: Diagnosis not present

## 2022-02-06 IMAGING — MG MM DIGITAL SCREENING BILAT W/ TOMO AND CAD
6 of 10 series · 6 of 30 positions shown · non-contrast
Comparison: Previous exam(s).

CLINICAL DATA: Screening.

EXAM:
DIGITAL SCREENING BILATERAL MAMMOGRAM WITH TOMOSYNTHESIS AND CAD
TECHNIQUE: Bilateral screening digital craniocaudal and mediolateral oblique
mammograms were obtained. Bilateral screening digital breast
tomosynthesis was performed. The images were evaluated with
computer-aided detection.

[L CC synth-2D (1 of 2)]
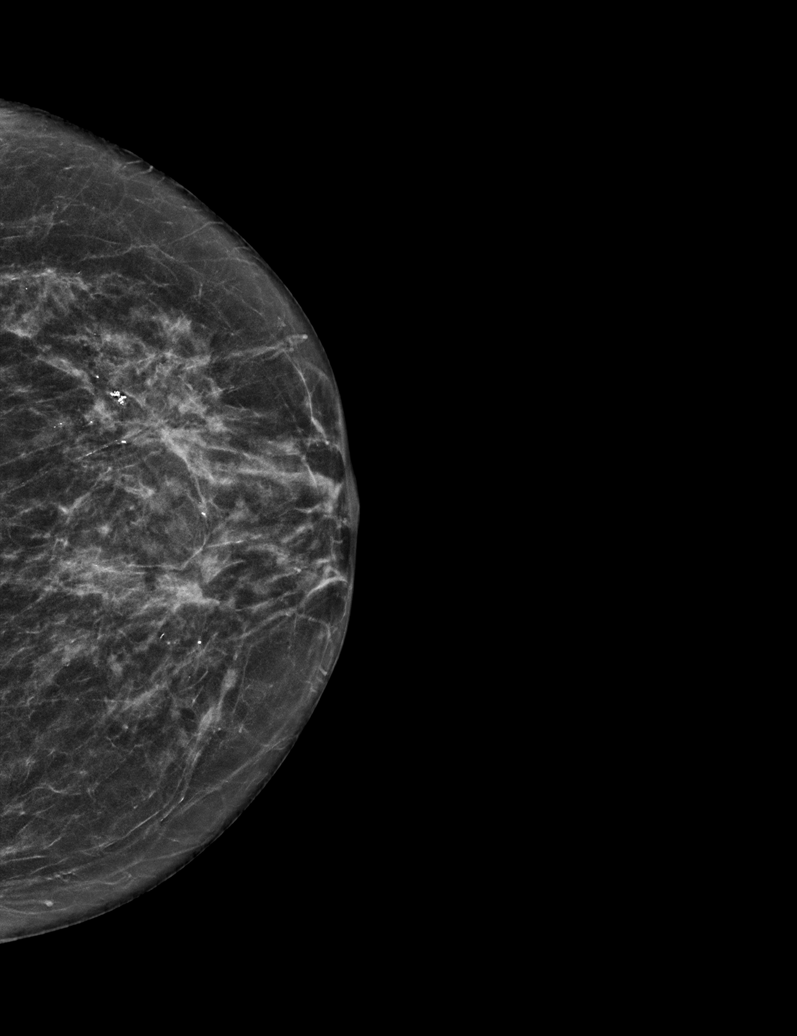

[L MLO synth-2D]
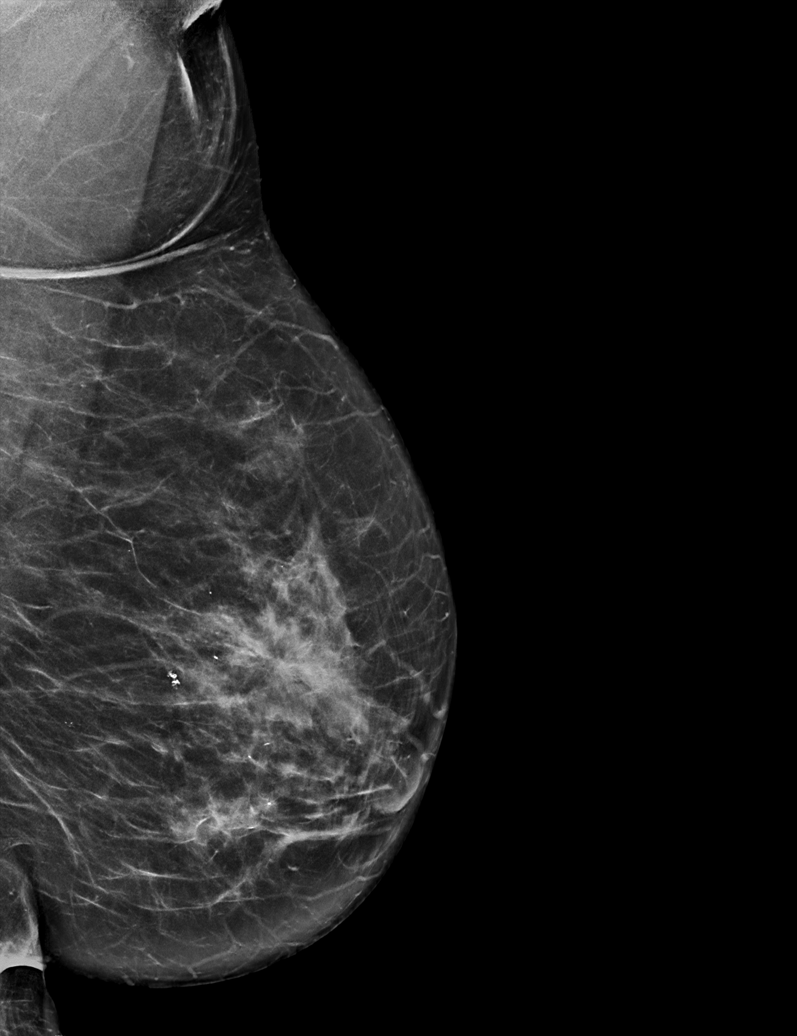

[L CC synth-2D (2 of 2)]
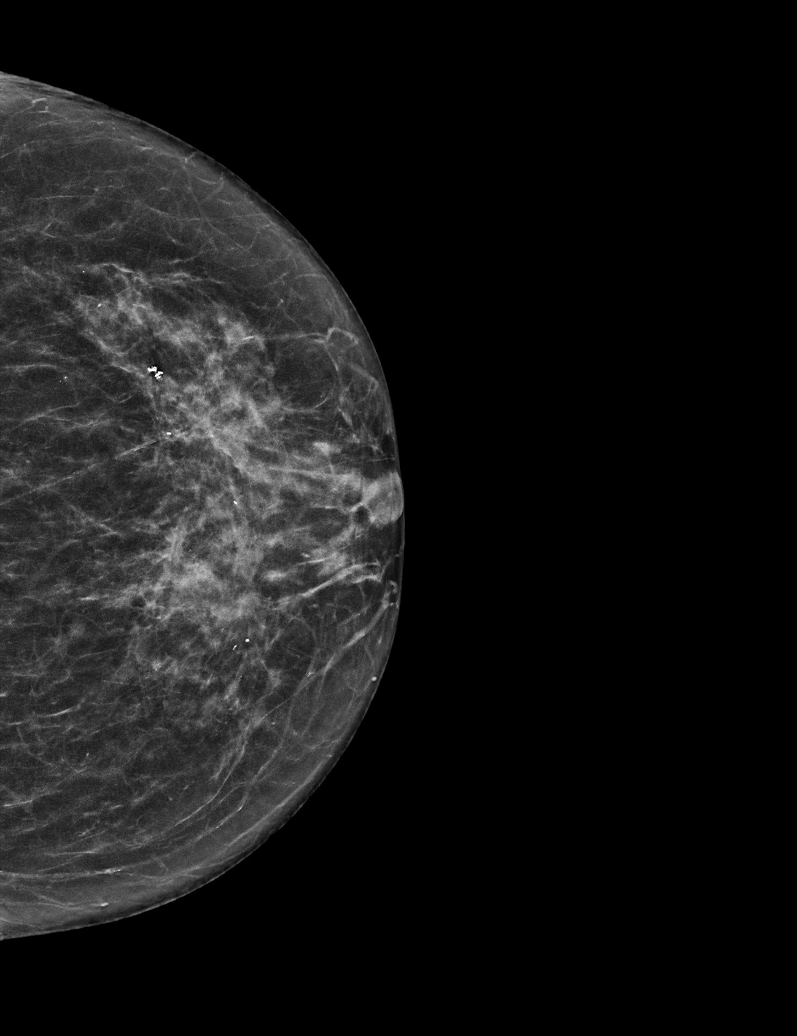

[R CC synth-2D]
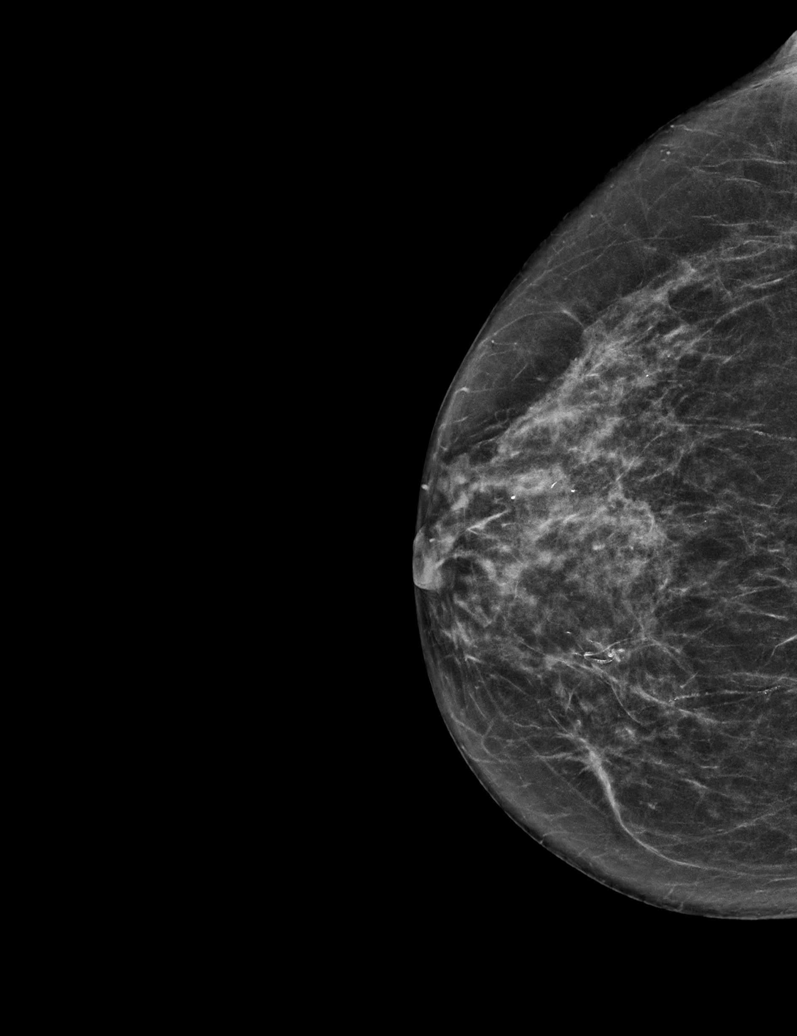

[R MLO synth-2D]
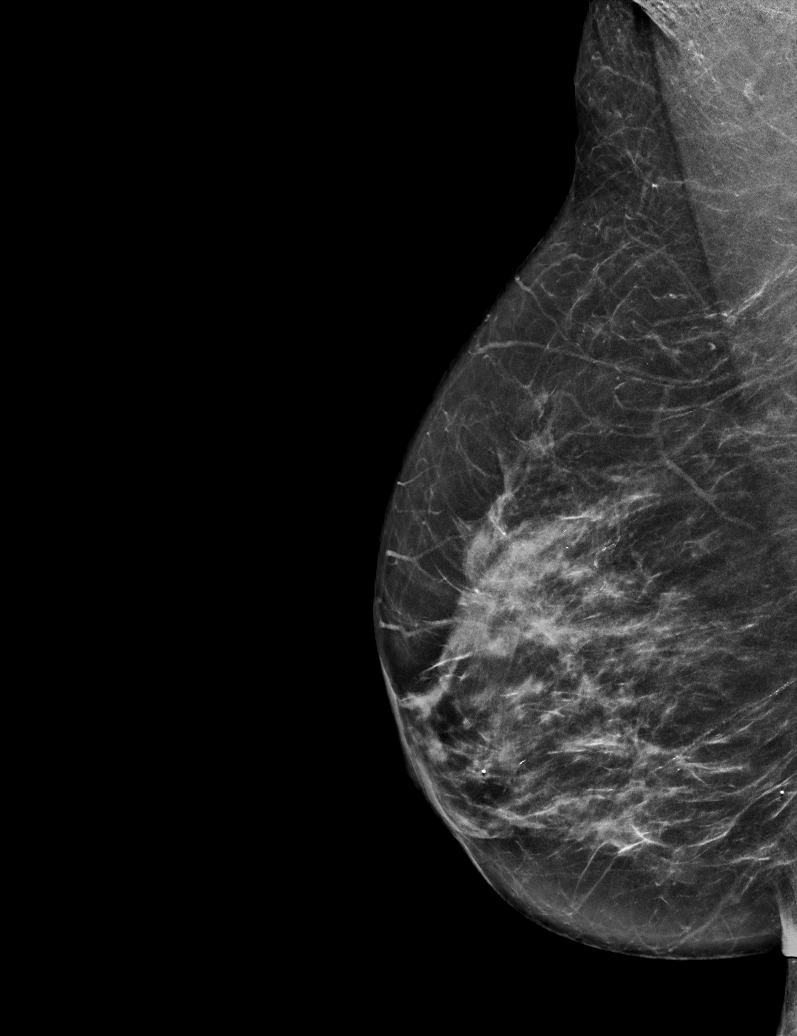

[R MLO tomo · tomo slice 31/61.0]
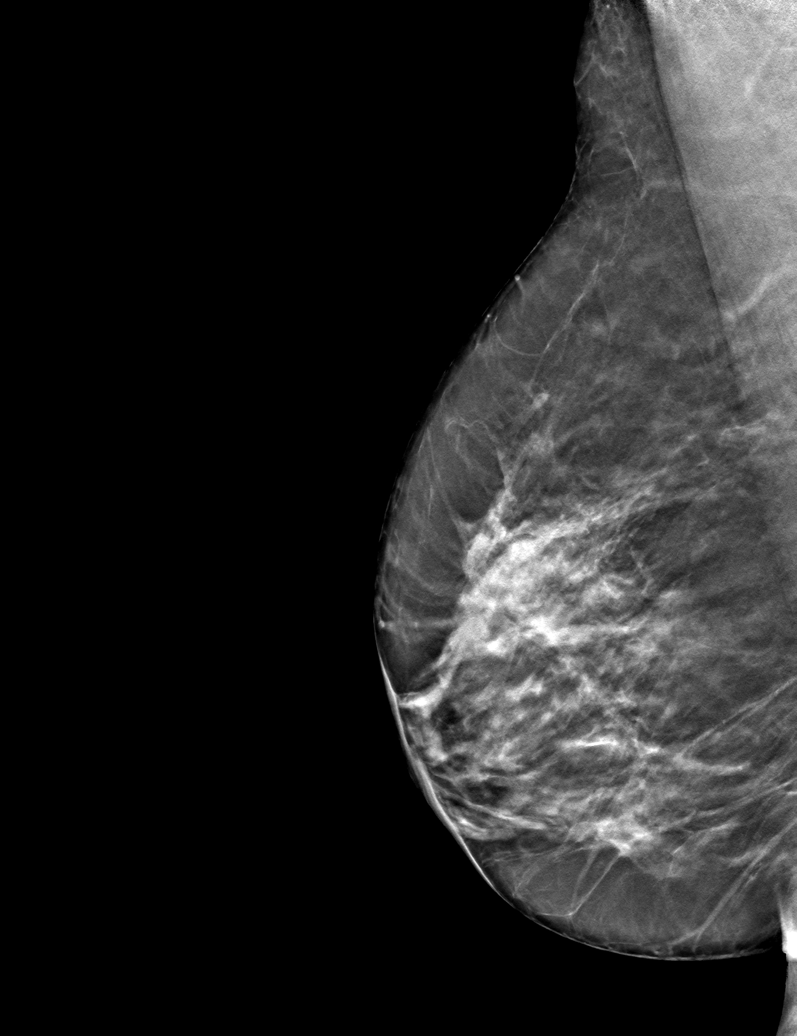

[6 of 30 positions shown; findings below may reference images not displayed]

ACR Breast Density Category c: The breast tissue is heterogeneously
dense, which may obscure small masses.
FINDINGS: In the right breast a possible asymmetry requires further
evaluation.

In the left breast possible asymmetry as well as possible distortion
requires further evaluation.
IMPRESSION: Further evaluation is suggested for possible asymmetry in the right
breast.

Further evaluation is suggested for possible asymmetry as well as
possible distortion in the left breast.

RECOMMENDATION:
Diagnostic mammogram and possibly ultrasound of both breasts.
(Code:[id number]-G-IIH)

The patient will be contacted regarding the findings, and additional
imaging will be scheduled.

BI-RADS CATEGORY  0: Incomplete. Need additional imaging evaluation
and/or prior mammograms for comparison.

## 2022-02-28 DIAGNOSIS — I10 Essential (primary) hypertension: Secondary | ICD-10-CM | POA: Diagnosis not present

## 2022-02-28 DIAGNOSIS — Z789 Other specified health status: Secondary | ICD-10-CM | POA: Diagnosis not present

## 2022-02-28 DIAGNOSIS — Z299 Encounter for prophylactic measures, unspecified: Secondary | ICD-10-CM | POA: Diagnosis not present

## 2022-02-28 DIAGNOSIS — T2220XA Burn of second degree of shoulder and upper limb, except wrist and hand, unspecified site, initial encounter: Secondary | ICD-10-CM | POA: Diagnosis not present

## 2022-03-11 DIAGNOSIS — H25813 Combined forms of age-related cataract, bilateral: Secondary | ICD-10-CM | POA: Diagnosis not present

## 2022-04-09 ENCOUNTER — Other Ambulatory Visit: Payer: Self-pay | Admitting: Internal Medicine

## 2022-04-09 DIAGNOSIS — R928 Other abnormal and inconclusive findings on diagnostic imaging of breast: Secondary | ICD-10-CM | POA: Diagnosis not present

## 2022-04-09 DIAGNOSIS — N6489 Other specified disorders of breast: Secondary | ICD-10-CM | POA: Diagnosis not present

## 2022-04-22 ENCOUNTER — Ambulatory Visit
Admission: RE | Admit: 2022-04-22 | Discharge: 2022-04-22 | Disposition: A | Discharge status: Home / Self Care | Payer: Medicare Other | Source: Ambulatory Visit | Attending: Internal Medicine | Admitting: Internal Medicine

## 2022-04-22 ENCOUNTER — Other Ambulatory Visit: Payer: Self-pay | Admitting: Internal Medicine

## 2022-04-22 DIAGNOSIS — R928 Other abnormal and inconclusive findings on diagnostic imaging of breast: Secondary | ICD-10-CM

## 2022-04-22 DIAGNOSIS — N62 Hypertrophy of breast: Secondary | ICD-10-CM | POA: Diagnosis not present

## 2022-04-22 DIAGNOSIS — D0512 Intraductal carcinoma in situ of left breast: Secondary | ICD-10-CM | POA: Diagnosis not present

## 2022-05-02 DIAGNOSIS — I10 Essential (primary) hypertension: Secondary | ICD-10-CM | POA: Diagnosis not present

## 2022-05-02 DIAGNOSIS — C801 Malignant (primary) neoplasm, unspecified: Secondary | ICD-10-CM | POA: Diagnosis not present

## 2022-05-02 DIAGNOSIS — Z299 Encounter for prophylactic measures, unspecified: Secondary | ICD-10-CM | POA: Diagnosis not present

## 2022-05-12 DIAGNOSIS — Z299 Encounter for prophylactic measures, unspecified: Secondary | ICD-10-CM | POA: Diagnosis not present

## 2022-05-12 DIAGNOSIS — I10 Essential (primary) hypertension: Secondary | ICD-10-CM | POA: Diagnosis not present

## 2022-05-12 DIAGNOSIS — C801 Malignant (primary) neoplasm, unspecified: Secondary | ICD-10-CM | POA: Diagnosis not present

## 2022-05-21 ENCOUNTER — Encounter: Payer: Self-pay | Admitting: General Surgery

## 2022-05-21 ENCOUNTER — Other Ambulatory Visit: Payer: Self-pay | Admitting: General Surgery

## 2022-05-21 ENCOUNTER — Ambulatory Visit (INDEPENDENT_AMBULATORY_CARE_PROVIDER_SITE_OTHER): Payer: Medicare Other | Admitting: General Surgery

## 2022-05-21 ENCOUNTER — Other Ambulatory Visit: Payer: Self-pay

## 2022-05-21 VITALS — BP 121/74 | HR 64 | Temp 97.7°F | Resp 16 | Ht 60.0 in | Wt 118.0 lb

## 2022-05-21 DIAGNOSIS — D0512 Intraductal carcinoma in situ of left breast: Secondary | ICD-10-CM | POA: Diagnosis not present

## 2022-05-21 NOTE — Progress Notes (Signed)
Amanda Riggs; [id number]; 07/13/1946   HPI Patient is a 76yo BF who was referred to my care by Dr. Woody Seller for evaluation and treatment of newly diagnosed left breast cancer.  Patient was found to have 2 lesions in the left breast on screening mammography.  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  The more lateral lesion was found to be sclerosing adenosis.  Patient denies any family history of breast cancer.  She has not felt a mass. Past Medical History:  Diagnosis Date   Anxiety    Chronic kidney disease    Depression    GERD (gastroesophageal reflux disease)    Hyperlipidemia    Hypertension    OA (osteoarthritis)    Subconjunctival hemorrhage     Past Surgical History:  Procedure Laterality Date   ABDOMINAL HYSTERECTOMY      Family History  Problem Relation Age of Onset   Diabetes Mother    Heart disease Mother    Heart disease Father    Alcohol abuse Father    Stroke Father    Breast cancer Neg Hx     Current Outpatient Medications on File Prior to Visit  Medication Sig Dispense Refill   aspirin EC 81 MG tablet Take 81 mg by mouth daily. Swallow whole.     busPIRone (BUSPAR) 30 MG tablet Take 30 mg by mouth 2 (two) times daily.     cyanocobalamin (VITAMIN B12) 1000 MCG tablet Take 1,000 mcg by mouth daily.     Multiple Vitamin (MULTIVITAMIN WITH MINERALS) TABS tablet Take 1 tablet by mouth daily.     omeprazole (PRILOSEC) 20 MG capsule Take 20 mg by mouth daily.     rosuvastatin (CRESTOR) 20 MG tablet Take 20 mg by mouth daily.     triamterene-hydrochlorothiazide (MAXZIDE-25) 37.5-25 MG tablet Take 1 tablet by mouth daily.     No current facility-administered medications on file prior to visit.    No Known Allergies  Social History   Substance and Sexual Activity  Alcohol Use Not Currently    Social History   Tobacco Use  Smoking Status Never   Passive exposure: Never  Smokeless Tobacco Never    Review of Systems  Constitutional: Negative.   HENT:   Positive for sinus pain.   Eyes: Negative.   Respiratory: Negative.    Cardiovascular: Negative.   Gastrointestinal:  Positive for heartburn.  Musculoskeletal:  Positive for joint pain.  Skin: Negative.   Neurological: Negative.   Endo/Heme/Allergies: Negative.   Psychiatric/Behavioral: Negative.      Objective   Vitals:   05/21/22 1204  BP: 121/74  Pulse: 64  Resp: 16  Temp: 97.7 F (36.5 C)  SpO2: 98%    Physical Exam Vitals reviewed. Exam conducted with a chaperone present.  Constitutional:      Appearance: Normal appearance. She is normal weight. She is not ill-appearing.  HENT:     Head: Normocephalic and atraumatic.  Cardiovascular:     Rate and Rhythm: Normal rate and regular rhythm.     Heart sounds: Normal heart sounds. No murmur heard.    No friction rub. No gallop.  Pulmonary:     Effort: Pulmonary effort is normal. No respiratory distress.     Breath sounds: Normal breath sounds. No stridor. No wheezing, rhonchi or rales.  Musculoskeletal:     Cervical back: Normal range of motion and neck supple.  Lymphadenopathy:     Cervical: No cervical adenopathy.  Skin:    General: Skin  is warm and dry.  Neurological:     Mental Status: She is alert and oriented to person, place, and time.   Breast: No dominant mass, nipple discharge, or dimpling are noted in either breast.  Both axillas negative for palpable nodes. Mammogram, biopsy results reviewed.  Left breast DCIS with ER, PR positive. Breast center is recommending bilateral MRI of the breasts. Assessment  DCIS, left breast Sclerosing adenosis, left breast Plan  I told the patient that we will schedule a bilateral breast MRI to further evaluate and assess her cancer.  She will return to my office in 1 week to go over those results and to discuss any further surgical intervention.  All her questions were answered.

## 2022-05-21 NOTE — Patient Instructions (Addendum)
Bilateral Breast MRI~ 05/26/2022 @ [facility]~  [phone], [place],  [address]  Nothing to eat or drink 4 hours prior to imaging.  No lotions, powders, perfumes or deodorants. No metal hairpins, hair glue or jewelry.  Please bring a photo ID and your insurance cards to appointment. If you need to cancel, please call more than 24 hours in advance to avoid $75 No Show Fee

## 2022-05-26 ENCOUNTER — Ambulatory Visit
Admission: RE | Admit: 2022-05-26 | Discharge: 2022-05-26 | Disposition: A | Discharge status: Home / Self Care | Payer: Medicare Other | Source: Ambulatory Visit | Attending: General Surgery | Admitting: General Surgery

## 2022-05-26 DIAGNOSIS — D0512 Intraductal carcinoma in situ of left breast: Secondary | ICD-10-CM | POA: Diagnosis not present

## 2022-05-26 MED ORDER — GADOPICLENOL 0.5 MMOL/ML IV SOLN
5.0000 mL | Freq: Once | INTRAVENOUS | Status: AC | PRN
  Administered 2022-05-26: 5 mL via INTRAVENOUS

## 2022-05-29 ENCOUNTER — Ambulatory Visit (INDEPENDENT_AMBULATORY_CARE_PROVIDER_SITE_OTHER): Payer: Medicare Other | Admitting: General Surgery

## 2022-05-29 ENCOUNTER — Other Ambulatory Visit (HOSPITAL_COMMUNITY): Payer: Self-pay | Admitting: General Surgery

## 2022-05-29 ENCOUNTER — Encounter: Payer: Self-pay | Admitting: General Surgery

## 2022-05-29 VITALS — BP 110/77 | HR 68 | Temp 97.8°F | Resp 14 | Ht 60.0 in | Wt 117.0 lb

## 2022-05-29 DIAGNOSIS — N6489 Other specified disorders of breast: Secondary | ICD-10-CM

## 2022-05-29 DIAGNOSIS — D0512 Intraductal carcinoma in situ of left breast: Secondary | ICD-10-CM

## 2022-05-29 NOTE — Progress Notes (Signed)
Subjective:     Amanda Riggs  Here to go over MRI results.  No other lesions noted in either breast.   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  Will thus do biopsy of sclerosing lesion in the left breast prior to making a definitive decision on treating the DCIS.  Patient scheduled for left breast breast biopsy after radiofrequency tag placement 06/18/22.  The risks and benefits of the procedure including bleeding and infection were fully explained to the patient, who gives informed consent.

## 2022-05-30 NOTE — H&P (Signed)
Amanda Riggs; [id number]; 07-Jan-1946   HPI Patient is a 76yo BF who was referred to my care by Dr. Woody Seller for evaluation and treatment of newly diagnosed left breast cancer.  Patient was found to have 2 lesions in the left breast on screening mammography.  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  The more lateral lesion was found to be sclerosing adenosis.  Patient denies any family history of breast cancer.  She has not felt a mass. Past Medical History:  Diagnosis Date   Anxiety    Chronic kidney disease    Depression    GERD (gastroesophageal reflux disease)    Hyperlipidemia    Hypertension    OA (osteoarthritis)    Subconjunctival hemorrhage     Past Surgical History:  Procedure Laterality Date   ABDOMINAL HYSTERECTOMY      Family History  Problem Relation Age of Onset   Diabetes Mother    Heart disease Mother    Heart disease Father    Alcohol abuse Father    Stroke Father    Breast cancer Neg Hx     Current Outpatient Medications on File Prior to Visit  Medication Sig Dispense Refill   aspirin EC 81 MG tablet Take 81 mg by mouth daily. Swallow whole.     busPIRone (BUSPAR) 30 MG tablet Take 30 mg by mouth 2 (two) times daily.     cyanocobalamin (VITAMIN B12) 1000 MCG tablet Take 1,000 mcg by mouth daily.     Multiple Vitamin (MULTIVITAMIN WITH MINERALS) TABS tablet Take 1 tablet by mouth daily.     omeprazole (PRILOSEC) 20 MG capsule Take 20 mg by mouth daily.     rosuvastatin (CRESTOR) 20 MG tablet Take 20 mg by mouth daily.     triamterene-hydrochlorothiazide (MAXZIDE-25) 37.5-25 MG tablet Take 1 tablet by mouth daily.     No current facility-administered medications on file prior to visit.    No Known Allergies  Social History   Substance and Sexual Activity  Alcohol Use Not Currently    Social History   Tobacco Use  Smoking Status Never   Passive exposure: Never  Smokeless Tobacco Never    Review of Systems  Constitutional: Negative.   HENT:   Positive for sinus pain.   Eyes: Negative.   Respiratory: Negative.    Cardiovascular: Negative.   Gastrointestinal:  Positive for heartburn.  Musculoskeletal:  Positive for joint pain.  Skin: Negative.   Neurological: Negative.   Endo/Heme/Allergies: Negative.   Psychiatric/Behavioral: Negative.      Objective   Vitals:   05/21/22 1204  BP: 121/74  Pulse: 64  Resp: 16  Temp: 97.7 F (36.5 C)  SpO2: 98%    Physical Exam Vitals reviewed. Exam conducted with a chaperone present.  Constitutional:      Appearance: Normal appearance. She is normal weight. She is not ill-appearing.  HENT:     Head: Normocephalic and atraumatic.  Cardiovascular:     Rate and Rhythm: Normal rate and regular rhythm.     Heart sounds: Normal heart sounds. No murmur heard.    No friction rub. No gallop.  Pulmonary:     Effort: Pulmonary effort is normal. No respiratory distress.     Breath sounds: Normal breath sounds. No stridor. No wheezing, rhonchi or rales.  Musculoskeletal:     Cervical back: Normal range of motion and neck supple.  Lymphadenopathy:     Cervical: No cervical adenopathy.  Skin:    General: Skin  is warm and dry.  Neurological:     Mental Status: She is alert and oriented to person, place, and time.   Breast: No dominant mass, nipple discharge, or dimpling are noted in either breast.  Both axillas negative for palpable nodes. Mammogram, biopsy results reviewed.  Left breast DCIS with ER, PR positive. Breast center is recommending bilateral MRI of the breasts. Assessment  DCIS, left breast Sclerosing adenosis, left breast Plan  I told the patient that we will schedule a bilateral breast MRI to further evaluate and assess her cancer.  She will return to my office in 1 week to go over those results and to discuss any further surgical intervention.  All her questions were answered. Addendum: In order to guide definitive treatment of her DCIS, will review a left breast biopsy  after tag placement for the sclerosing adenosis to rule out occult cancer on 06/18/2022.  The risks and benefits of the procedure were fully explained to the patient, who gave informed consent.

## 2022-06-13 NOTE — Patient Instructions (Signed)
MICHELENE KENISTON  06/13/2022     '@PREFPERIOPPHARMACY'$ @   Your procedure is scheduled on  06/18/2022.   Report to [facility] at  [phone]  A.M.   Call this number if you have problems the morning of surgery:  [phone]  If you experience any cold or flu symptoms such as cough, fever, chills, shortness of breath, etc. between now and your scheduled surgery, please notify us at the above number.   Remember:  Do not eat or drink after midnight.     Take these medicines the morning of surgery with A SIP OF WATER                                           buspirone.    Do not wear jewelry, make-up or nail polish.  Do not wear lotions, powders, or perfumes, or deodorant.  Do not shave 48 hours prior to surgery.  Men may shave face and neck.  Do not bring valuables to the hospital.  [facility] is not responsible for any belongings or valuables.  Contacts, dentures or bridgework may not be worn into surgery.  Leave your suitcase in the car.  After surgery it may be brought to your room.  For patients admitted to the hospital, discharge time will be determined by your treatment team.  Patients discharged the day of surgery will not be allowed to drive home and must have someone with them for 24 hours.    Special instructions:   DO NOT smoke tobacco or vape for 24 hours before your procedure.  Please read over the following fact sheets that you were given. Pain Booklet, Coughing and Deep Breathing, Anesthesia Post-op Instructions, and Care and Recovery After Surgery       Lumpectomy, Care After The following information offers guidance on how to care for yourself after your procedure. Your health care provider may also give you more specific instructions. If you have problems or questions, contact your health care provider. What can I expect after the procedure? After the procedure, it is common to have: Some pain or redness at the incision site. Breast swelling. Breast  tenderness. Stiffness in your arm or shoulder. A change in the shape and feel of your breast. Scar tissue that feels hard to the touch in the area where the lump was removed. Follow these instructions at home: Medicines Take over-the-counter and prescription medicines only as told by your health care provider. If you were prescribed an antibiotic, take it as told by your health care provider. Do not stop taking the antibiotic even if you start to feel better. Ask your health care provider if the medicine prescribed to you: Requires you to avoid driving or using machinery. Can cause constipation. You may need to take these actions to prevent or treat constipation: Drink enough fluid to keep your urine pale yellow. Take over-the-counter or prescription medicines. Eat foods that are high in fiber, such as beans, whole grains, and fresh fruits and vegetables. Limit foods that are high in fat and processed sugars, such as fried or sweet foods. Incision care     Follow instructions from your health care provider about how to take care of your incision. Make sure you: Wash your hands with soap and water for at least 20 seconds before and after you change your bandage (dressing). If soap  and water are not available, use hand sanitizer. Change your dressing as told by your health care provider. Leave stitches (sutures), skin glue, or adhesive strips in place. These skin closures may need to stay in place for 2 weeks or longer. If adhesive strip edges start to loosen and curl up, you may trim the loose edges. Do not remove adhesive strips completely unless your health care provider tells you to do that. Check your incision area every day for signs of infection. Check for: More redness, swelling, or pain. Fluid or blood. Warmth. Pus or a bad smell. Keep your dressing clean and dry. If you were sent home with a surgical drain in place, follow instructions from your health care provider about  emptying it. Bathing Do not take baths, swim, or use a hot tub until your health care provider approves. Ask your health care provider if you may take showers. You may only be allowed to take sponge baths. Activity Rest as told by your health care provider. Do not sit for a long time without moving. Get up to take short walks every 1-2 hours. This will improve blood flow and breathing. Ask for help if you feel weak or unsteady. Be careful to avoid any activities that could cause an injury to your arm on the side of your surgery. Do not lift anything that is heavier than 10 lb (4.5 kg), or the limit that you are told, until your health care provider says that it is safe. Avoid lifting with the arm that is on the side of your surgery. Do not carry heavy objects on your shoulder on the side of your surgery. Do exercises to keep your shoulder and arm from getting stiff and swollen. Talk with your health care provider about which exercises are safe for you. Return to your normal activities as told by your health care provider. Ask your health care provider what activities are safe for you. General instructions Wear a supportive bra as told by your health care provider. Raise (elevate) your arm above the level of your heart while you are sitting or lying down. Do not wear tight jewelry on your arm, wrist, or fingers on the side of your surgery. Wear compression stockings as told by your health care provider. These stockings help to prevent blood clots and reduce swelling in your legs. If you had any lymph nodes removed during your procedure, be sure to tell all of your health care providers. It is important to share this information before you have certain procedures, such as blood tests or blood pressure measurements. Keep all follow-up visits. You may need to be screened for extra fluid around the lymph nodes and swelling in the breast and arm (lymphedema). Contact a health care provider if: You  develop a rash. You have a fever. Your pain worsens or pain medicine is not working. You have swelling, weakness, or numbness in your arm that does not improve after a few weeks. You have new swelling in your breast. You have any of these signs of infection: More redness, swelling, or pain in your incision area. Fluid or blood coming from your incision. Warmth coming from the incision area. Pus or a bad smell coming from your incision. Get help right away if: You have very bad pain in your breast or arm. You have swelling in your legs or arms. You have redness, warmth, or pain in your leg or arm. You have chest pain. You have difficulty breathing. These symptoms may be  an emergency. Get help right away. Call 911. Do not wait to see if the symptoms will go away. Do not drive yourself to the hospital. Summary After the procedure, it is common to have breast tenderness, swelling in your breast, and stiffness in your arm and shoulder. Follow instructions from your health care provider about how to take care of your incision. Do not lift anything that is heavier than 10 lb (4.5 kg), or the limit that you are told, until your health care provider says that it is safe. Avoid lifting with the arm that is on the side of your surgery. If you had any lymph nodes removed during your procedure, be sure to tell all of your health care providers. This information is not intended to replace advice given to you by your health care provider. Make sure you discuss any questions you have with your health care provider. Document Revised: 09/08/2021 Document Reviewed: 09/08/2021 Elsevier Patient Education  [place] Anesthesia, Adult, Care After The following information offers guidance on how to care for yourself after your procedure. Your health care provider may also give you more specific instructions. If you have problems or questions, contact your health care provider. What can I  expect after the procedure? After the procedure, it is common for people to: Have pain or discomfort at the IV site. Have nausea or vomiting. Have a sore throat or hoarseness. Have trouble concentrating. Feel cold or chills. Feel weak, sleepy, or tired (fatigue). Have soreness and body aches. These can affect parts of the body that were not involved in surgery. Follow these instructions at home: For the time period you were told by your health care provider:  Rest. Do not participate in activities where you could fall or become injured. Do not drive or use machinery. Do not drink alcohol. Do not take sleeping pills or medicines that cause drowsiness. Do not make important decisions or sign legal documents. Do not take care of children on your own. General instructions Drink enough fluid to keep your urine pale yellow. If you have sleep apnea, surgery and certain medicines can increase your risk for breathing problems. Follow instructions from your health care provider about wearing your sleep device: Anytime you are sleeping, including during daytime naps. While taking prescription pain medicines, sleeping medicines, or medicines that make you drowsy. Return to your normal activities as told by your health care provider. Ask your health care provider what activities are safe for you. Take over-the-counter and prescription medicines only as told by your health care provider. Do not use any products that contain nicotine or tobacco. These products include cigarettes, chewing tobacco, and vaping devices, such as e-cigarettes. These can delay incision healing after surgery. If you need help quitting, ask your health care provider. Contact a health care provider if: You have nausea or vomiting that does not get better with medicine. You vomit every time you eat or drink. You have pain that does not get better with medicine. You cannot urinate or have bloody urine. You develop a skin  rash. You have a fever. Get help right away if: You have trouble breathing. You have chest pain. You vomit blood. These symptoms may be an emergency. Get help right away. Call 911. Do not wait to see if the symptoms will go away. Do not drive yourself to the hospital. Summary After the procedure, it is common to have a sore throat, hoarseness, nausea, vomiting, or to feel weak, sleepy, or fatigue. For  the time period you were told by your health care provider, do not drive or use machinery. Get help right away if you have difficulty breathing, have chest pain, or vomit blood. These symptoms may be an emergency. This information is not intended to replace advice given to you by your health care provider. Make sure you discuss any questions you have with your health care provider. Document Revised: 09/27/2021 Document Reviewed: 09/27/2021 Elsevier Patient Education  [place]. How to Use Chlorhexidine Before Surgery Chlorhexidine gluconate (CHG) is a germ-killing (antiseptic) solution that is used to clean the skin. It can get rid of the bacteria that normally live on the skin and can keep them away for about 24 hours. To clean your skin with CHG, you may be given: A CHG solution to use in the shower or as part of a sponge bath. A prepackaged cloth that contains CHG. Cleaning your skin with CHG may help lower the risk for infection: While you are staying in the intensive care unit of the hospital. If you have a vascular access, such as a central line, to provide short-term or long-term access to your veins. If you have a catheter to drain urine from your bladder. If you are on a ventilator. A ventilator is a machine that helps you breathe by moving air in and out of your lungs. After surgery. What are the risks? Risks of using CHG include: A skin reaction. Hearing loss, if CHG gets in your ears and you have a perforated eardrum. Eye injury, if CHG gets in your eyes and is not  rinsed out. The CHG product catching fire. Make sure that you avoid smoking and flames after applying CHG to your skin. Do not use CHG: If you have a chlorhexidine allergy or have previously reacted to chlorhexidine. On babies younger than 12 months of age. How to use CHG solution Use CHG only as told by your health care provider, and follow the instructions on the label. Use the full amount of CHG as directed. Usually, this is one bottle. During a shower Follow these steps when using CHG solution during a shower (unless your health care provider gives you different instructions): Start the shower. Use your normal soap and shampoo to wash your face and hair. Turn off the shower or move out of the shower stream. Pour the CHG onto a clean washcloth. Do not use any type of brush or rough-edged sponge. Starting at your neck, lather your body down to your toes. Make sure you follow these instructions: If you will be having surgery, pay special attention to the part of your body where you will be having surgery. Scrub this area for at least 1 minute. Do not use CHG on your head or face. If the solution gets into your ears or eyes, rinse them well with water. Avoid your genital area. Avoid any areas of skin that have broken skin, cuts, or scrapes. Scrub your back and under your arms. Make sure to wash skin folds. Let the lather sit on your skin for 1-2 minutes or as long as told by your health care provider. Thoroughly rinse your entire body in the shower. Make sure that all body creases and crevices are rinsed well. Dry off with a clean towel. Do not put any substances on your body afterward--such as powder, lotion, or perfume--unless you are told to do so by your health care provider. Only use lotions that are recommended by the manufacturer. Put on clean clothes or  pajamas. If it is the night before your surgery, sleep in clean sheets.  During a sponge bath Follow these steps when using CHG  solution during a sponge bath (unless your health care provider gives you different instructions): Use your normal soap and shampoo to wash your face and hair. Pour the CHG onto a clean washcloth. Starting at your neck, lather your body down to your toes. Make sure you follow these instructions: If you will be having surgery, pay special attention to the part of your body where you will be having surgery. Scrub this area for at least 1 minute. Do not use CHG on your head or face. If the solution gets into your ears or eyes, rinse them well with water. Avoid your genital area. Avoid any areas of skin that have broken skin, cuts, or scrapes. Scrub your back and under your arms. Make sure to wash skin folds. Let the lather sit on your skin for 1-2 minutes or as long as told by your health care provider. Using a different clean, wet washcloth, thoroughly rinse your entire body. Make sure that all body creases and crevices are rinsed well. Dry off with a clean towel. Do not put any substances on your body afterward--such as powder, lotion, or perfume--unless you are told to do so by your health care provider. Only use lotions that are recommended by the manufacturer. Put on clean clothes or pajamas. If it is the night before your surgery, sleep in clean sheets. How to use CHG prepackaged cloths Only use CHG cloths as told by your health care provider, and follow the instructions on the label. Use the CHG cloth on clean, dry skin. Do not use the CHG cloth on your head or face unless your health care provider tells you to. When washing with the CHG cloth: Avoid your genital area. Avoid any areas of skin that have broken skin, cuts, or scrapes. Before surgery Follow these steps when using a CHG cloth to clean before surgery (unless your health care provider gives you different instructions): Using the CHG cloth, vigorously scrub the part of your body where you will be having surgery. Scrub using a  back-and-forth motion for 3 minutes. The area on your body should be completely wet with CHG when you are done scrubbing. Do not rinse. Discard the cloth and let the area air-dry. Do not put any substances on the area afterward, such as powder, lotion, or perfume. Put on clean clothes or pajamas. If it is the night before your surgery, sleep in clean sheets.  For general bathing Follow these steps when using CHG cloths for general bathing (unless your health care provider gives you different instructions). Use a separate CHG cloth for each area of your body. Make sure you wash between any folds of skin and between your fingers and toes. Wash your body in the following order, switching to a new cloth after each step: The front of your neck, shoulders, and chest. Both of your arms, under your arms, and your hands. Your stomach and groin area, avoiding the genitals. Your right leg and foot. Your left leg and foot. The back of your neck, your back, and your buttocks. Do not rinse. Discard the cloth and let the area air-dry. Do not put any substances on your body afterward--such as powder, lotion, or perfume--unless you are told to do so by your health care provider. Only use lotions that are recommended by the manufacturer. Put on clean clothes or pajamas. Contact  a health care provider if: Your skin gets irritated after scrubbing. You have questions about using your solution or cloth. You swallow any chlorhexidine. Call your local poison control center (1-279-278-9474 in the [place] Get help right away if: Your eyes itch badly, or they become very red or swollen. Your skin itches badly and is red or swollen. Your hearing changes. You have trouble seeing. You have swelling or tingling in your mouth or throat. You have trouble breathing. These symptoms may represent a serious problem that is an emergency. Do not wait to see if the symptoms will go away. Get medical help right away. Call your  local emergency services (911 in the [place] Do not drive yourself to the hospital. Summary Chlorhexidine gluconate (CHG) is a germ-killing (antiseptic) solution that is used to clean the skin. Cleaning your skin with CHG may help to lower your risk for infection. You may be given CHG to use for bathing. It may be in a bottle or in a prepackaged cloth to use on your skin. Carefully follow your health care provider's instructions and the instructions on the product label. Do not use CHG if you have a chlorhexidine allergy. Contact your health care provider if your skin gets irritated after scrubbing. This information is not intended to replace advice given to you by your health care provider. Make sure you discuss any questions you have with your health care provider. Document Revised: 10/28/2021 Document Reviewed: 09/10/2020 Elsevier Patient Education  [place].

## 2022-06-16 ENCOUNTER — Encounter (HOSPITAL_COMMUNITY)
Admission: RE | Admit: 2022-06-16 | Discharge: 2022-06-16 | Disposition: A | Discharge status: Home / Self Care | Payer: Medicare Other | Source: Ambulatory Visit | Attending: General Surgery | Admitting: General Surgery

## 2022-06-16 ENCOUNTER — Other Ambulatory Visit: Payer: Self-pay

## 2022-06-16 ENCOUNTER — Encounter (HOSPITAL_COMMUNITY): Payer: Self-pay

## 2022-06-16 ENCOUNTER — Other Ambulatory Visit (HOSPITAL_COMMUNITY): Payer: Self-pay | Admitting: General Surgery

## 2022-06-16 ENCOUNTER — Ambulatory Visit (HOSPITAL_COMMUNITY)
Admission: RE | Admit: 2022-06-16 | Discharge: 2022-06-16 | Disposition: A | Discharge status: Home / Self Care | Payer: Medicare Other | Source: Ambulatory Visit | Attending: General Surgery | Admitting: General Surgery

## 2022-06-16 VITALS — BP 151/74 | HR 65 | Temp 97.8°F | Resp 16 | Ht 60.0 in | Wt 117.0 lb

## 2022-06-16 DIAGNOSIS — C50412 Malignant neoplasm of upper-outer quadrant of left female breast: Secondary | ICD-10-CM

## 2022-06-16 DIAGNOSIS — D0512 Intraductal carcinoma in situ of left breast: Secondary | ICD-10-CM

## 2022-06-16 DIAGNOSIS — R9431 Abnormal electrocardiogram [ECG] [EKG]: Secondary | ICD-10-CM | POA: Insufficient documentation

## 2022-06-16 DIAGNOSIS — I10 Essential (primary) hypertension: Secondary | ICD-10-CM

## 2022-06-16 DIAGNOSIS — Z01818 Encounter for other preprocedural examination: Secondary | ICD-10-CM | POA: Insufficient documentation

## 2022-06-16 DIAGNOSIS — C50919 Malignant neoplasm of unspecified site of unspecified female breast: Secondary | ICD-10-CM | POA: Diagnosis not present

## 2022-06-16 DIAGNOSIS — Z170 Estrogen receptor positive status [ER+]: Secondary | ICD-10-CM | POA: Insufficient documentation

## 2022-06-16 DIAGNOSIS — N6489 Other specified disorders of breast: Secondary | ICD-10-CM

## 2022-06-16 LAB — CBC WITH DIFFERENTIAL/PLATELET
Abs Immature Granulocytes: 0.01 10*3/uL (ref 0.00–0.07)
Basophils Absolute: 0.1 10*3/uL (ref 0.0–0.1)
Basophils Relative: 1 %
Eosinophils Absolute: 0.6 10*3/uL — ABNORMAL HIGH (ref 0.0–0.5)
Eosinophils Relative: 10 %
HCT: 37.8 % (ref 36.0–46.0)
Hemoglobin: 12.1 g/dL (ref 12.0–15.0)
Immature Granulocytes: 0 %
Lymphocytes Relative: 33 %
Lymphs Abs: 1.9 10*3/uL (ref 0.7–4.0)
MCH: 29.3 pg (ref 26.0–34.0)
MCHC: 32 g/dL (ref 30.0–36.0)
MCV: 91.5 fL (ref 80.0–100.0)
Monocytes Absolute: 0.4 10*3/uL (ref 0.1–1.0)
Monocytes Relative: 6 %
Neutro Abs: 2.9 10*3/uL (ref 1.7–7.7)
Neutrophils Relative %: 50 %
Platelets: 189 10*3/uL (ref 150–400)
RBC: 4.13 MIL/uL (ref 3.87–5.11)
RDW: 13.9 % (ref 11.5–15.5)
WBC: 5.9 10*3/uL (ref 4.0–10.5)
nRBC: 0 % (ref 0.0–0.2)

## 2022-06-16 LAB — COMPREHENSIVE METABOLIC PANEL
ALT: 16 U/L (ref 0–44)
AST: 20 U/L (ref 15–41)
Albumin: 4 g/dL (ref 3.5–5.0)
Alkaline Phosphatase: 52 U/L (ref 38–126)
Anion gap: 7 (ref 5–15)
BUN: 17 mg/dL (ref 8–23)
CO2: 25 mmol/L (ref 22–32)
Calcium: 9.2 mg/dL (ref 8.9–10.3)
Chloride: 105 mmol/L (ref 98–111)
Creatinine, Ser: 0.86 mg/dL (ref 0.44–1.00)
GFR, Estimated: >60 mL/min (ref >60)
Glucose, Bld: 89 mg/dL (ref 70–99)
Potassium: 3.8 mmol/L (ref 3.5–5.1)
Sodium: 137 mmol/L (ref 135–145)
Total Bilirubin: 0.8 mg/dL (ref 0.3–1.2)
Total Protein: 7 g/dL (ref 6.5–8.1)

## 2022-06-17 ENCOUNTER — Ambulatory Visit (HOSPITAL_COMMUNITY)
Admission: RE | Admit: 2022-06-17 | Discharge: 2022-06-17 | Disposition: A | Discharge status: Home / Self Care | Payer: Medicare Other | Source: Ambulatory Visit | Attending: General Surgery | Admitting: General Surgery

## 2022-06-17 DIAGNOSIS — N6489 Other specified disorders of breast: Secondary | ICD-10-CM

## 2022-06-17 DIAGNOSIS — D0512 Intraductal carcinoma in situ of left breast: Secondary | ICD-10-CM

## 2022-06-17 DIAGNOSIS — R928 Other abnormal and inconclusive findings on diagnostic imaging of breast: Secondary | ICD-10-CM | POA: Diagnosis not present

## 2022-06-17 HISTORY — PX: BREAST BIOPSY: SHX20

## 2022-06-17 MED ORDER — LIDOCAINE HCL (PF) 2 % IJ SOLN
INTRAMUSCULAR | Status: AC
  Filled 2022-06-17: qty 20

## 2022-06-18 ENCOUNTER — Encounter (HOSPITAL_COMMUNITY)
Admission: RE | Disposition: A | Discharge status: Home / Self Care | Payer: Self-pay | Source: Home / Self Care | Attending: General Surgery

## 2022-06-18 ENCOUNTER — Encounter (HOSPITAL_COMMUNITY): Payer: Self-pay | Admitting: General Surgery

## 2022-06-18 ENCOUNTER — Ambulatory Visit (HOSPITAL_COMMUNITY)
Admission: RE | Admit: 2022-06-18 | Discharge: 2022-06-18 | Disposition: A | Discharge status: Home / Self Care | Payer: Medicare Other | Source: Ambulatory Visit | Attending: General Surgery | Admitting: General Surgery

## 2022-06-18 ENCOUNTER — Other Ambulatory Visit (HOSPITAL_COMMUNITY): Payer: Self-pay | Admitting: General Surgery

## 2022-06-18 ENCOUNTER — Other Ambulatory Visit: Payer: Self-pay

## 2022-06-18 ENCOUNTER — Ambulatory Visit (HOSPITAL_COMMUNITY)
Admission: RE | Admit: 2022-06-18 | Discharge: 2022-06-18 | Disposition: A | Discharge status: Home / Self Care | Payer: Medicare Other | Attending: General Surgery | Admitting: General Surgery

## 2022-06-18 ENCOUNTER — Ambulatory Visit (HOSPITAL_BASED_OUTPATIENT_CLINIC_OR_DEPARTMENT_OTHER): Payer: Medicare Other | Admitting: Anesthesiology

## 2022-06-18 ENCOUNTER — Ambulatory Visit (HOSPITAL_COMMUNITY): Payer: Medicare Other | Admitting: Anesthesiology

## 2022-06-18 DIAGNOSIS — R921 Mammographic calcification found on diagnostic imaging of breast: Secondary | ICD-10-CM | POA: Diagnosis not present

## 2022-06-18 DIAGNOSIS — Z79899 Other long term (current) drug therapy: Secondary | ICD-10-CM | POA: Diagnosis not present

## 2022-06-18 DIAGNOSIS — I129 Hypertensive chronic kidney disease with stage 1 through stage 4 chronic kidney disease, or unspecified chronic kidney disease: Secondary | ICD-10-CM | POA: Insufficient documentation

## 2022-06-18 DIAGNOSIS — Z170 Estrogen receptor positive status [ER+]: Secondary | ICD-10-CM

## 2022-06-18 DIAGNOSIS — M1990 Unspecified osteoarthritis, unspecified site: Secondary | ICD-10-CM | POA: Diagnosis not present

## 2022-06-18 DIAGNOSIS — N6489 Other specified disorders of breast: Secondary | ICD-10-CM

## 2022-06-18 DIAGNOSIS — N189 Chronic kidney disease, unspecified: Secondary | ICD-10-CM | POA: Diagnosis not present

## 2022-06-18 DIAGNOSIS — N6012 Diffuse cystic mastopathy of left breast: Secondary | ICD-10-CM | POA: Diagnosis not present

## 2022-06-18 DIAGNOSIS — N6022 Fibroadenosis of left breast: Secondary | ICD-10-CM | POA: Insufficient documentation

## 2022-06-18 DIAGNOSIS — K219 Gastro-esophageal reflux disease without esophagitis: Secondary | ICD-10-CM | POA: Insufficient documentation

## 2022-06-18 DIAGNOSIS — E785 Hyperlipidemia, unspecified: Secondary | ICD-10-CM | POA: Insufficient documentation

## 2022-06-18 DIAGNOSIS — F32A Depression, unspecified: Secondary | ICD-10-CM | POA: Diagnosis not present

## 2022-06-18 DIAGNOSIS — F419 Anxiety disorder, unspecified: Secondary | ICD-10-CM | POA: Diagnosis not present

## 2022-06-18 DIAGNOSIS — Z853 Personal history of malignant neoplasm of breast: Secondary | ICD-10-CM | POA: Insufficient documentation

## 2022-06-18 DIAGNOSIS — N6320 Unspecified lump in the left breast, unspecified quadrant: Secondary | ICD-10-CM

## 2022-06-18 DIAGNOSIS — R928 Other abnormal and inconclusive findings on diagnostic imaging of breast: Secondary | ICD-10-CM | POA: Diagnosis not present

## 2022-06-18 DIAGNOSIS — N62 Hypertrophy of breast: Secondary | ICD-10-CM | POA: Diagnosis not present

## 2022-06-18 HISTORY — PX: BREAST LUMPECTOMY WITH RADIOFREQUENCY TAG IDENTIFICATION: SHX6884

## 2022-06-18 SURGERY — BREAST LUMPECTOMY WITH RADIOFREQUENCY TAG IDENTIFICATION
Anesthesia: General | Site: Breast | Laterality: Left

## 2022-06-18 MED ORDER — EPHEDRINE SULFATE (PRESSORS) 50 MG/ML IJ SOLN
INTRAMUSCULAR | Status: DC | PRN
  Administered 2022-06-18 (×5): 5 mg via INTRAVENOUS

## 2022-06-18 MED ORDER — LACTATED RINGERS IV SOLN: INTRAVENOUS | Status: DC

## 2022-06-18 MED ORDER — CHLORHEXIDINE GLUCONATE CLOTH 2 % EX PADS: 6.0000 | MEDICATED_PAD | Freq: Once | CUTANEOUS | Status: DC

## 2022-06-18 MED ORDER — FENTANYL CITRATE (PF) 100 MCG/2ML IJ SOLN
INTRAMUSCULAR | Status: AC
  Filled 2022-06-18: qty 2

## 2022-06-18 MED ORDER — HYDROMORPHONE HCL 1 MG/ML IJ SOLN: 0.2500 mg | INTRAMUSCULAR | Status: DC | PRN

## 2022-06-18 MED ORDER — ONDANSETRON HCL 4 MG/2ML IJ SOLN
INTRAMUSCULAR | Status: DC | PRN
  Administered 2022-06-18: 4 mg via INTRAVENOUS

## 2022-06-18 MED ORDER — FENTANYL CITRATE (PF) 100 MCG/2ML IJ SOLN
INTRAMUSCULAR | Status: DC | PRN
  Administered 2022-06-18: 50 ug via INTRAVENOUS

## 2022-06-18 MED ORDER — DEXAMETHASONE SODIUM PHOSPHATE 10 MG/ML IJ SOLN
INTRAMUSCULAR | Status: DC | PRN
  Administered 2022-06-18: 10 mg via INTRAVENOUS

## 2022-06-18 MED ORDER — PROPOFOL 10 MG/ML IV BOLUS
INTRAVENOUS | Status: DC | PRN
  Administered 2022-06-18: 100 mg via INTRAVENOUS
  Administered 2022-06-18: 50 mg via INTRAVENOUS

## 2022-06-18 MED ORDER — CEFAZOLIN SODIUM-DEXTROSE 2-4 GM/100ML-% IV SOLN
INTRAVENOUS | Status: AC
  Filled 2022-06-18: qty 100

## 2022-06-18 MED ORDER — BUPIVACAINE HCL (PF) 0.5 % IJ SOLN
INTRAMUSCULAR | Status: AC
  Filled 2022-06-18: qty 30

## 2022-06-18 MED ORDER — CEFAZOLIN SODIUM-DEXTROSE 2-4 GM/100ML-% IV SOLN
2.0000 g | INTRAVENOUS | Status: AC
  Administered 2022-06-18: 2 g via INTRAVENOUS

## 2022-06-18 MED ORDER — ONDANSETRON HCL 4 MG/2ML IJ SOLN: 4.0000 mg | Freq: Once | INTRAMUSCULAR | Status: DC | PRN

## 2022-06-18 MED ORDER — LIDOCAINE HCL (CARDIAC) PF 100 MG/5ML IV SOSY
PREFILLED_SYRINGE | INTRAVENOUS | Status: DC | PRN
  Administered 2022-06-18: 60 mg via INTRAVENOUS

## 2022-06-18 MED ORDER — CHLORHEXIDINE GLUCONATE 0.12 % MT SOLN: 15.0000 mL | Freq: Once | OROMUCOSAL | Status: AC

## 2022-06-18 MED ORDER — BUPIVACAINE HCL (PF) 0.5 % IJ SOLN
INTRAMUSCULAR | Status: DC | PRN
  Administered 2022-06-18: 10 mL

## 2022-06-18 MED ORDER — SODIUM CHLORIDE 0.9 % IR SOLN
Status: DC | PRN
  Administered 2022-06-18: 1000 mL

## 2022-06-18 MED ORDER — KETOROLAC TROMETHAMINE 30 MG/ML IJ SOLN
15.0000 mg | Freq: Once | INTRAMUSCULAR | Status: AC
  Administered 2022-06-18: 15 mg via INTRAVENOUS
  Filled 2022-06-18: qty 1

## 2022-06-18 MED ORDER — CHLORHEXIDINE GLUCONATE 0.12 % MT SOLN
OROMUCOSAL | Status: AC
  Administered 2022-06-18: 15 mL via OROMUCOSAL
  Filled 2022-06-18: qty 15

## 2022-06-18 MED ORDER — CHLORHEXIDINE GLUCONATE CLOTH 2 % EX PADS
6.0000 | MEDICATED_PAD | Freq: Once | CUTANEOUS | Status: AC
  Administered 2022-06-18: 6 via TOPICAL

## 2022-06-18 MED ORDER — TRAMADOL HCL 50 MG PO TABS: 50.0000 mg | ORAL_TABLET | Freq: Four times a day (QID) | ORAL | 0 refills | Status: DC | PRN

## 2022-06-18 MED ORDER — ORAL CARE MOUTH RINSE: 15.0000 mL | Freq: Once | OROMUCOSAL | Status: AC

## 2022-06-18 SURGICAL SUPPLY — 31 items
ADH SKN CLS APL DERMABOND .7 (GAUZE/BANDAGES/DRESSINGS) ×1
APL PRP STRL LF DISP 70% ISPRP (MISCELLANEOUS) ×1
BLADE SURG 15 STRL LF DISP TIS (BLADE) ×1 IMPLANT
BLADE SURG 15 STRL SS (BLADE) ×1
CHLORAPREP W/TINT 26 (MISCELLANEOUS) ×1 IMPLANT
CLOTH BEACON ORANGE TIMEOUT ST (SAFETY) ×1 IMPLANT
COVER LIGHT HANDLE STERIS (MISCELLANEOUS) ×2 IMPLANT
DERMABOND ADVANCED .7 DNX12 (GAUZE/BANDAGES/DRESSINGS) ×1 IMPLANT
DEVICE DUBIN W/COMP PLATE 8390 (MISCELLANEOUS) IMPLANT
ELECT REM PT RETURN 9FT ADLT (ELECTROSURGICAL) ×1
ELECTRODE REM PT RTRN 9FT ADLT (ELECTROSURGICAL) ×1 IMPLANT
GLOVE BIOGEL PI IND STRL 7.0 (GLOVE) ×2 IMPLANT
GLOVE SURG SS PI 7.0 STRL IVOR (GLOVE) IMPLANT
GLOVE SURG SS PI 7.5 STRL IVOR (GLOVE) ×2 IMPLANT
GOWN STRL REUS W/TWL LRG LVL3 (GOWN DISPOSABLE) ×2 IMPLANT
KIT TURNOVER KIT A (KITS) ×1 IMPLANT
MANIFOLD NEPTUNE II (INSTRUMENTS) ×1 IMPLANT
MARKER SKIN DUAL TIP RULER LAB (MISCELLANEOUS) ×1 IMPLANT
NDL HYPO 25X1 1.5 SAFETY (NEEDLE) ×1 IMPLANT
NEEDLE HYPO 25X1 1.5 SAFETY (NEEDLE) ×1 IMPLANT
NS IRRIG 1000ML POUR BTL (IV SOLUTION) ×1 IMPLANT
PACK MINOR (CUSTOM PROCEDURE TRAY) ×1 IMPLANT
PAD ARMBOARD 7.5X6 YLW CONV (MISCELLANEOUS) ×1 IMPLANT
SET BASIN LINEN APH (SET/KITS/TRAYS/PACK) ×1 IMPLANT
SET LOCALIZER 20 PROBE US (MISCELLANEOUS) ×1 IMPLANT
SUT MNCRL AB 4-0 PS2 18 (SUTURE) ×1 IMPLANT
SUT SILK 2 0 SH (SUTURE) IMPLANT
SUT VIC AB 3-0 SH 27 (SUTURE) ×1
SUT VIC AB 3-0 SH 27X BRD (SUTURE) IMPLANT
SYR BULB IRRIG 60ML STRL (SYRINGE) IMPLANT
SYR CONTROL 10ML LL (SYRINGE) ×1 IMPLANT

## 2022-06-18 NOTE — Anesthesia Procedure Notes (Signed)
Procedure Name: LMA Insertion Date/Time: 06/18/2022 8:38 AM  Performed by: Annamary Carolin, CRNAPre-anesthesia Checklist: Patient identified, Emergency Drugs available, Suction available and Patient being monitored Patient Re-evaluated:Patient Re-evaluated prior to induction Oxygen Delivery Method: Circle System Utilized Preoxygenation: Pre-oxygenation with 100% oxygen Induction Type: IV induction Ventilation: Mask ventilation without difficulty LMA: LMA inserted LMA Size: 4.0 Number of attempts: 1 Airway Equipment and Method: Bite block Placement Confirmation: positive ETCO2 Tube secured with: Tape Dental Injury: Teeth and Oropharynx as per pre-operative assessment  Comments: With ease

## 2022-06-18 NOTE — Anesthesia Postprocedure Evaluation (Signed)
Anesthesia Post Note  Patient: Amanda Riggs  Procedure(s) Performed: BREAST LUMPECTOMY WITH RADIOFREQUENCY TAG IDENTIFICATION (Left: Breast)  Patient location during evaluation: Phase II Anesthesia Type: General Level of consciousness: awake and alert and oriented Pain management: pain level controlled Vital Signs Assessment: post-procedure vital signs reviewed and stable Respiratory status: spontaneous breathing, nonlabored ventilation and respiratory function stable Cardiovascular status: blood pressure returned to baseline and stable Postop Assessment: no apparent nausea or vomiting Anesthetic complications: no  No notable events documented.   Last Vitals:  Vitals:   06/18/22 1000 06/18/22 1026  BP: 131/61 119/67  Pulse: 77 75  Resp: 17 18  Temp:  (!) 36.4 C  SpO2: 100% 100%    Last Pain:  Vitals:   06/18/22 1026  TempSrc: Oral  PainSc: 6                  Delayne Sanzo C Oktober Glazer

## 2022-06-18 NOTE — Transfer of Care (Signed)
Immediate Anesthesia Transfer of Care Note  Patient: Amanda Riggs  Procedure(s) Performed: BREAST LUMPECTOMY WITH RADIOFREQUENCY TAG IDENTIFICATION (Left: Breast)  Patient Location: PACU  Anesthesia Type:General  Level of Consciousness: awake, alert , oriented and patient cooperative  Airway & Oxygen Therapy: Patient Spontanous Breathing and Patient connected to face mask oxygen  Post-op Assessment: Report given to RN, Post -op Vital signs reviewed and stable and Patient moving all extremities X 4  Post vital signs: Reviewed and stable  Last Vitals:  Vitals Value Taken Time  BP 120/70 06/18/22 0945  Temp    Pulse    Resp 17 06/18/22 0946  SpO2    Vitals shown include unvalidated device data.  Last Pain:  Vitals:   06/18/22 0723  TempSrc: Oral  PainSc: 0-No pain         Complications: No notable events documented. VSS.

## 2022-06-18 NOTE — Op Note (Signed)
Patient:  Amanda Riggs  DOB:  [birth year]  MRN:  [id number]   Preop Diagnosis: Sclerosing adenosis, left breast  Postop Diagnosis: Same  Procedure: Left breast biopsy after radiofrequency tag placement  Surgeon: Aviva Signs, MD  Anes: General  Indications: Patient is a 76 year old black female with known history of ductal carcinoma in situ of the left breast who has an additional sclerosing adenosis lesion in another quadrant.  In order to determine definitive therapy for her cancer, we need to know whether the sclerosing adenosis actually has occult malignancy.  The risks and benefits of the procedure including bleeding and infection were fully explained to the patient, who gave informed consent.  Procedure note: The patient was placed in the supine position.  After general anesthesia was administered, the left breast was prepped and draped using usual sterile technique with ChloraPrep.  Surgical site confirmation was performed.  The tag was noted in the upper, outer quadrant of the left breast just outside the areola.  Using the localizer, an incision was made in the upper outer quadrant just outside the areola.  This was taken down to the suspected lesion.  The representative tissue was removed.  A short suture was placed superiorly and a long suture placed laterally for orientation purposes.  Specimen radiography revealed the tag and X clip to be within the specimen removed.  It was then sent to pathology further examination.  A bleeding was controlled using Bovie electrocautery.  The wound was irrigated with normal saline.  0.5% Sensorcaine was instilled into the surrounding wound.  Subcutaneous layer was reapproximated using a 4-0 Vicryl interrupted suture.  The skin was closed using a 4-0 Monocryl subcuticular suture.  Dermabond was then applied.  All tape and needle counts were correct at the end of the procedure.  The patient was awakened and transferred to PACU in stable  condition.  Complications: None  EBL: Minimal  Specimen: Left breast biopsy

## 2022-06-18 NOTE — Anesthesia Preprocedure Evaluation (Addendum)
Anesthesia Evaluation  Patient identified by MRN, date of birth, ID band Patient awake    Reviewed: Allergy & Precautions, H&P , NPO status , Patient's Chart, lab work & pertinent test results  Airway Mallampati: II  TM Distance: >3 FB Neck ROM: Full    Dental  (+) Upper Dentures, Dental Advisory Given   Pulmonary neg pulmonary ROS   Pulmonary exam normal breath sounds clear to auscultation       Cardiovascular Exercise Tolerance: Good hypertension, Pt. on medications  Rhythm:Regular Rate:Normal + Systolic murmurs [id number] 11:12:25 [place] [facility]-AP-300 ROUTINE RECORD 1945/12/03 (37 yr) Female Black Vent. rate 62 BPM PR interval 160 ms QRS duration 88 ms QT/QTcB 408/414 ms P-R-T axes 67 56 18 Normal sinus rhythm Septal infarct , age undetermined Abnormal ECG No previous ECGs available Confirmed by Asencion Noble [phone]) on 06/17/2022 6:32:18 AM   Neuro/Psych  PSYCHIATRIC DISORDERS Anxiety Depression    negative neurological ROS     GI/Hepatic Neg liver ROS,GERD  ,,  Endo/Other  negative endocrine ROS    Renal/GU Renal InsufficiencyRenal disease  negative genitourinary   Musculoskeletal  (+) Arthritis , Osteoarthritis,    Abdominal   Peds negative pediatric ROS (+)  Hematology negative hematology ROS (+)   Anesthesia Other Findings   Reproductive/Obstetrics negative OB ROS                             Anesthesia Physical Anesthesia Plan  ASA: 2  Anesthesia Plan: General   Post-op Pain Management: Dilaudid IV   Induction: Intravenous  PONV Risk Score and Plan: 4 or greater and Ondansetron and Dexamethasone  Airway Management Planned: LMA  Additional Equipment:   Intra-op Plan:   Post-operative Plan: Extubation in OR  Informed Consent: I have reviewed the patients History and Physical, chart, labs and discussed the procedure including the risks, benefits and  alternatives for the proposed anesthesia with the patient or authorized representative who has indicated his/her understanding and acceptance.     Dental advisory given  Plan Discussed with: CRNA and Surgeon  Anesthesia Plan Comments:        Anesthesia Quick Evaluation

## 2022-06-18 NOTE — Interval H&P Note (Signed)
History and Physical Interval Note:  06/18/2022 8:01 AM  Amanda Riggs  has presented today for surgery, with the diagnosis of LATERAL LEFT BREAST COMPLEX SCLEROSING LESION.  The various methods of treatment have been discussed with the patient and family. After consideration of risks, benefits and other options for treatment, the patient has consented to  Procedure(s): BREAST LUMPECTOMY WITH RADIOFREQUENCY TAG IDENTIFICATION (Left) as a surgical intervention.  The patient's history has been reviewed, patient examined, no change in status, stable for surgery.  I have reviewed the patient's chart and labs.  Questions were answered to the patient's satisfaction.     Aviva Signs

## 2022-06-20 LAB — SURGICAL PATHOLOGY

## 2022-06-23 ENCOUNTER — Encounter (HOSPITAL_COMMUNITY): Payer: Self-pay | Admitting: General Surgery

## 2022-06-24 ENCOUNTER — Ambulatory Visit (INDEPENDENT_AMBULATORY_CARE_PROVIDER_SITE_OTHER): Payer: Medicare Other | Admitting: General Surgery

## 2022-06-24 ENCOUNTER — Encounter: Payer: Self-pay | Admitting: General Surgery

## 2022-06-24 VITALS — BP 131/77 | HR 85 | Temp 97.7°F | Resp 12 | Ht 60.0 in | Wt 118.0 lb

## 2022-06-24 DIAGNOSIS — Z09 Encounter for follow-up examination after completed treatment for conditions other than malignant neoplasm: Secondary | ICD-10-CM

## 2022-06-24 NOTE — Progress Notes (Signed)
Subjective:     Amanda Riggs  Here for postoperative visit, status post left breast biopsy.  She has done well from the procedure. Objective:    BP 131/77   Pulse 85   Temp 97.7 F (36.5 C) (Oral)   Resp 12   Ht 5' (1.524 m)   Wt 118 lb (53.5 kg)   SpO2 99%   BMI 23.05 kg/m   General:  alert, cooperative, and no distress  Left breast incision healing well Final pathology was negative for malignancy     Assessment:    Doing well postoperatively. DCIS, left breast    Plan:   I told the patient that now I can just proceed with a left partial mastectomy in 1 quadrant.  She does not want a simple mastectomy.  Given her age, she is less likely to undergo radiation therapy.  She would like to think about the surgery and will call me to let me know when to schedule the surgery.  The risks and benefits of the procedure including bleeding and infection were fully explained to the patient, who gave informed consent.

## 2022-07-31 ENCOUNTER — Ambulatory Visit (INDEPENDENT_AMBULATORY_CARE_PROVIDER_SITE_OTHER): Payer: Medicare Other | Admitting: General Surgery

## 2022-07-31 ENCOUNTER — Encounter: Payer: Self-pay | Admitting: General Surgery

## 2022-07-31 VITALS — BP 157/79 | HR 70 | Temp 98.2°F | Resp 12 | Ht 60.0 in | Wt 120.0 lb

## 2022-07-31 DIAGNOSIS — D0512 Intraductal carcinoma in situ of left breast: Secondary | ICD-10-CM

## 2022-07-31 NOTE — Progress Notes (Signed)
Subjective:     Amanda Riggs  Patient returns to schedule her definitive surgery for her left breast cancer.  She would like to proceed with a left partial mastectomy. Objective:    BP (!) 157/79   Pulse 70   Temp 98.2 F (36.8 C) (Oral)   Resp 12   Ht 5' (1.524 m)   Wt 120 lb (54.4 kg)   SpO2 98%   BMI 23.44 kg/m   General:  alert, cooperative, and no distress       Assessment:    Ductal carcinoma in situ, left breast    Plan:   Patient is scheduled for left partial mastectomy on 08/20/2022.  The risks and benefits of the procedure including bleeding, infection, and incomplete margins were fully explained to the patient, who gave informed consent.

## 2022-08-04 NOTE — H&P (Signed)
Amanda Riggs; [id number]; [birth year]   HPI Patient is a 77yo BF who was referred to my care by Dr. Woody Seller for evaluation and treatment of newly diagnosed left breast cancer.  Patient was found to have 2 lesions in the left breast on screening mammography.  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  The more lateral lesion was found to be sclerosing adenosis.  Patient denies any family history of breast cancer.  She has not felt a mass. Past Medical History:  Diagnosis Date   Anxiety    Chronic kidney disease    Depression    GERD (gastroesophageal reflux disease)    Hyperlipidemia    Hypertension    OA (osteoarthritis)    Subconjunctival hemorrhage     Past Surgical History:  Procedure Laterality Date   ABDOMINAL HYSTERECTOMY      Family History  Problem Relation Age of Onset   Diabetes Mother    Heart disease Mother    Heart disease Father    Alcohol abuse Father    Stroke Father    Breast cancer Neg Hx     Current Outpatient Medications on File Prior to Visit  Medication Sig Dispense Refill   aspirin EC 81 MG tablet Take 81 mg by mouth daily. Swallow whole.     busPIRone (BUSPAR) 30 MG tablet Take 30 mg by mouth 2 (two) times daily.     cyanocobalamin (VITAMIN B12) 1000 MCG tablet Take 1,000 mcg by mouth daily.     Multiple Vitamin (MULTIVITAMIN WITH MINERALS) TABS tablet Take 1 tablet by mouth daily.     omeprazole (PRILOSEC) 20 MG capsule Take 20 mg by mouth daily.     rosuvastatin (CRESTOR) 20 MG tablet Take 20 mg by mouth daily.     triamterene-hydrochlorothiazide (MAXZIDE-25) 37.5-25 MG tablet Take 1 tablet by mouth daily.     No current facility-administered medications on file prior to visit.    No Known Allergies  Social History   Substance and Sexual Activity  Alcohol Use Not Currently    Social History   Tobacco Use  Smoking Status Never   Passive exposure: Never  Smokeless Tobacco Never    Review of Systems  Constitutional: Negative.   HENT:   Positive for sinus pain.   Eyes: Negative.   Respiratory: Negative.    Cardiovascular: Negative.   Gastrointestinal:  Positive for heartburn.  Musculoskeletal:  Positive for joint pain.  Skin: Negative.   Neurological: Negative.   Endo/Heme/Allergies: Negative.   Psychiatric/Behavioral: Negative.      Objective   Vitals:   05/21/22 1204  BP: 121/74  Pulse: 64  Resp: 16  Temp: 97.7 F (36.5 C)  SpO2: 98%    Physical Exam Vitals reviewed. Exam conducted with a chaperone present.  Constitutional:      Appearance: Normal appearance. She is normal weight. She is not ill-appearing.  HENT:     Head: Normocephalic and atraumatic.  Cardiovascular:     Rate and Rhythm: Normal rate and regular rhythm.     Heart sounds: Normal heart sounds. No murmur heard.    No friction rub. No gallop.  Pulmonary:     Effort: Pulmonary effort is normal. No respiratory distress.     Breath sounds: Normal breath sounds. No stridor. No wheezing, rhonchi or rales.  Musculoskeletal:     Cervical back: Normal range of motion and neck supple.  Lymphadenopathy:     Cervical: No cervical adenopathy.  Skin:    General: Skin  is warm and dry.  Neurological:     Mental Status: She is alert and oriented to person, place, and time.   Breast: No dominant mass, nipple discharge, or dimpling are noted in either breast.  Both axillas negative for palpable nodes. Mammogram, biopsy results reviewed.  Left breast DCIS with ER, PR positive. Breast center is recommending bilateral MRI of the breasts. Assessment  DCIS, left breast Sclerosing adenosis, left breast Plan  Patient is scheduled for a left partial mastectomy after radiofrequency tag placement on 08/20/2022.  The risks and benefits of the procedure including bleeding, infection, cardiopulmonary difficulties, the possibility of unclear margins were fully explained to the patient, who gave informed consent.

## 2022-08-18 ENCOUNTER — Other Ambulatory Visit: Payer: Self-pay

## 2022-08-18 ENCOUNTER — Encounter (HOSPITAL_COMMUNITY): Payer: Self-pay

## 2022-08-18 ENCOUNTER — Encounter (HOSPITAL_COMMUNITY)
Admission: RE | Admit: 2022-08-18 | Discharge: 2022-08-18 | Disposition: A | Discharge status: Home / Self Care | Payer: Medicare Other | Source: Ambulatory Visit | Attending: General Surgery | Admitting: General Surgery

## 2022-08-18 HISTORY — DX: Unspecified hearing loss, unspecified ear: H91.90

## 2022-08-20 ENCOUNTER — Ambulatory Visit (HOSPITAL_BASED_OUTPATIENT_CLINIC_OR_DEPARTMENT_OTHER): Payer: Medicare Other | Admitting: Certified Registered Nurse Anesthetist

## 2022-08-20 ENCOUNTER — Other Ambulatory Visit: Payer: Self-pay

## 2022-08-20 ENCOUNTER — Ambulatory Visit (HOSPITAL_COMMUNITY): Payer: Medicare Other | Admitting: Certified Registered Nurse Anesthetist

## 2022-08-20 ENCOUNTER — Encounter (HOSPITAL_COMMUNITY): Payer: Self-pay | Admitting: General Surgery

## 2022-08-20 ENCOUNTER — Ambulatory Visit (HOSPITAL_COMMUNITY): Payer: Medicare Other

## 2022-08-20 ENCOUNTER — Ambulatory Visit (HOSPITAL_COMMUNITY)
Admission: RE | Admit: 2022-08-20 | Discharge: 2022-08-20 | Disposition: A | Discharge status: Home / Self Care | Payer: Medicare Other | Source: Ambulatory Visit | Attending: General Surgery | Admitting: General Surgery

## 2022-08-20 ENCOUNTER — Encounter (HOSPITAL_COMMUNITY)
Admission: RE | Disposition: A | Discharge status: Home / Self Care | Payer: Self-pay | Source: Ambulatory Visit | Attending: General Surgery

## 2022-08-20 DIAGNOSIS — C50912 Malignant neoplasm of unspecified site of left female breast: Secondary | ICD-10-CM | POA: Diagnosis not present

## 2022-08-20 DIAGNOSIS — M2550 Pain in unspecified joint: Secondary | ICD-10-CM | POA: Diagnosis not present

## 2022-08-20 DIAGNOSIS — N6022 Fibroadenosis of left breast: Secondary | ICD-10-CM | POA: Insufficient documentation

## 2022-08-20 DIAGNOSIS — D0512 Intraductal carcinoma in situ of left breast: Secondary | ICD-10-CM | POA: Diagnosis not present

## 2022-08-20 DIAGNOSIS — F419 Anxiety disorder, unspecified: Secondary | ICD-10-CM | POA: Insufficient documentation

## 2022-08-20 DIAGNOSIS — N6012 Diffuse cystic mastopathy of left breast: Secondary | ICD-10-CM | POA: Diagnosis not present

## 2022-08-20 DIAGNOSIS — R12 Heartburn: Secondary | ICD-10-CM | POA: Insufficient documentation

## 2022-08-20 DIAGNOSIS — N6489 Other specified disorders of breast: Secondary | ICD-10-CM | POA: Diagnosis not present

## 2022-08-20 DIAGNOSIS — I129 Hypertensive chronic kidney disease with stage 1 through stage 4 chronic kidney disease, or unspecified chronic kidney disease: Secondary | ICD-10-CM | POA: Diagnosis not present

## 2022-08-20 DIAGNOSIS — N189 Chronic kidney disease, unspecified: Secondary | ICD-10-CM | POA: Insufficient documentation

## 2022-08-20 DIAGNOSIS — K219 Gastro-esophageal reflux disease without esophagitis: Secondary | ICD-10-CM | POA: Insufficient documentation

## 2022-08-20 DIAGNOSIS — D242 Benign neoplasm of left breast: Secondary | ICD-10-CM | POA: Insufficient documentation

## 2022-08-20 DIAGNOSIS — Z170 Estrogen receptor positive status [ER+]: Secondary | ICD-10-CM | POA: Insufficient documentation

## 2022-08-20 DIAGNOSIS — R928 Other abnormal and inconclusive findings on diagnostic imaging of breast: Secondary | ICD-10-CM | POA: Diagnosis not present

## 2022-08-20 DIAGNOSIS — N62 Hypertrophy of breast: Secondary | ICD-10-CM | POA: Insufficient documentation

## 2022-08-20 DIAGNOSIS — F32A Depression, unspecified: Secondary | ICD-10-CM | POA: Diagnosis not present

## 2022-08-20 SURGERY — PARTIAL MASTECTOMY WITH RADIO FREQUENCY LOCALIZER
Anesthesia: General | Site: Breast | Laterality: Left

## 2022-08-20 MED ORDER — ONDANSETRON HCL 4 MG/2ML IJ SOLN
INTRAMUSCULAR | Status: DC | PRN
  Administered 2022-08-20: 4 mg via INTRAVENOUS

## 2022-08-20 MED ORDER — LIDOCAINE HCL (CARDIAC) PF 100 MG/5ML IV SOSY
PREFILLED_SYRINGE | INTRAVENOUS | Status: DC | PRN
  Administered 2022-08-20: 20 mg via INTRAVENOUS

## 2022-08-20 MED ORDER — TRAMADOL HCL 50 MG PO TABS: 50.0000 mg | ORAL_TABLET | Freq: Four times a day (QID) | ORAL | 0 refills | Status: AC | PRN

## 2022-08-20 MED ORDER — CHLORHEXIDINE GLUCONATE CLOTH 2 % EX PADS: 6.0000 | MEDICATED_PAD | Freq: Once | CUTANEOUS | Status: DC

## 2022-08-20 MED ORDER — EPHEDRINE SULFATE (PRESSORS) 50 MG/ML IJ SOLN
INTRAMUSCULAR | Status: DC | PRN
  Administered 2022-08-20 (×4): 5 mg via INTRAVENOUS

## 2022-08-20 MED ORDER — ONDANSETRON HCL 4 MG/2ML IJ SOLN: 4.0000 mg | Freq: Once | INTRAMUSCULAR | Status: DC | PRN

## 2022-08-20 MED ORDER — PROPOFOL 10 MG/ML IV BOLUS
INTRAVENOUS | Status: AC
  Filled 2022-08-20: qty 20

## 2022-08-20 MED ORDER — ONDANSETRON HCL 4 MG/2ML IJ SOLN
INTRAMUSCULAR | Status: AC
  Filled 2022-08-20: qty 2

## 2022-08-20 MED ORDER — PROPOFOL 10 MG/ML IV BOLUS
INTRAVENOUS | Status: DC | PRN
  Administered 2022-08-20: 50 mg via INTRAVENOUS
  Administered 2022-08-20: 40 mg via INTRAVENOUS
  Administered 2022-08-20: 110 mg via INTRAVENOUS

## 2022-08-20 MED ORDER — DEXAMETHASONE SODIUM PHOSPHATE 10 MG/ML IJ SOLN
INTRAMUSCULAR | Status: DC | PRN
  Administered 2022-08-20: 5 mg via INTRAVENOUS

## 2022-08-20 MED ORDER — DEXAMETHASONE SODIUM PHOSPHATE 10 MG/ML IJ SOLN
INTRAMUSCULAR | Status: AC
  Filled 2022-08-20: qty 1

## 2022-08-20 MED ORDER — BUPIVACAINE LIPOSOME 1.3 % IJ SUSP
INTRAMUSCULAR | Status: DC | PRN
  Administered 2022-08-20: 20 mL

## 2022-08-20 MED ORDER — FENTANYL CITRATE (PF) 250 MCG/5ML IJ SOLN
INTRAMUSCULAR | Status: DC | PRN
  Administered 2022-08-20: 50 ug via INTRAVENOUS
  Administered 2022-08-20 (×2): 25 ug via INTRAVENOUS

## 2022-08-20 MED ORDER — FENTANYL CITRATE (PF) 100 MCG/2ML IJ SOLN
INTRAMUSCULAR | Status: AC
  Filled 2022-08-20: qty 2

## 2022-08-20 MED ORDER — LIDOCAINE HCL (PF) 2 % IJ SOLN
INTRAMUSCULAR | Status: AC
  Filled 2022-08-20: qty 5

## 2022-08-20 MED ORDER — 0.9 % SODIUM CHLORIDE (POUR BTL) OPTIME
TOPICAL | Status: DC | PRN
  Administered 2022-08-20: 1000 mL

## 2022-08-20 MED ORDER — BUPIVACAINE LIPOSOME 1.3 % IJ SUSP
INTRAMUSCULAR | Status: AC
  Filled 2022-08-20: qty 20

## 2022-08-20 MED ORDER — FENTANYL CITRATE PF 50 MCG/ML IJ SOSY: 25.0000 ug | PREFILLED_SYRINGE | INTRAMUSCULAR | Status: DC | PRN

## 2022-08-20 MED ORDER — CEFAZOLIN SODIUM-DEXTROSE 2-4 GM/100ML-% IV SOLN
INTRAVENOUS | Status: AC
  Filled 2022-08-20: qty 100

## 2022-08-20 MED ORDER — LACTATED RINGERS IV SOLN: INTRAVENOUS | Status: DC | PRN

## 2022-08-20 MED ORDER — EPHEDRINE 5 MG/ML INJ
INTRAVENOUS | Status: AC
  Filled 2022-08-20: qty 5

## 2022-08-20 MED ORDER — OXYCODONE HCL 5 MG PO TABS: 5.0000 mg | ORAL_TABLET | Freq: Once | ORAL | Status: DC | PRN

## 2022-08-20 MED ORDER — CEFAZOLIN SODIUM-DEXTROSE 2-4 GM/100ML-% IV SOLN
2.0000 g | INTRAVENOUS | Status: AC
  Administered 2022-08-20: 2 g via INTRAVENOUS

## 2022-08-20 MED ORDER — OXYCODONE HCL 5 MG/5ML PO SOLN: 5.0000 mg | Freq: Once | ORAL | Status: DC | PRN

## 2022-08-20 SURGICAL SUPPLY — 31 items
ADH SKN CLS APL DERMABOND .7 (GAUZE/BANDAGES/DRESSINGS) ×2
CLOTH BEACON ORANGE TIMEOUT ST (SAFETY) ×1 IMPLANT
COVER LIGHT HANDLE STERIS (MISCELLANEOUS) ×2 IMPLANT
COVER PROBE W GEL 5X96 (DRAPES) ×1 IMPLANT
DERMABOND ADVANCED .7 DNX12 (GAUZE/BANDAGES/DRESSINGS) IMPLANT
DEVICE DUBIN SPECIMEN MAMMOGRA (MISCELLANEOUS) IMPLANT
DURAPREP 26ML APPLICATOR (WOUND CARE) ×1 IMPLANT
ELECT REM PT RETURN 9FT ADLT (ELECTROSURGICAL) ×1
ELECTRODE REM PT RTRN 9FT ADLT (ELECTROSURGICAL) ×1 IMPLANT
GLOVE BIOGEL PI IND STRL 7.0 (GLOVE) ×2 IMPLANT
GLOVE SURG SS PI 7.5 STRL IVOR (GLOVE) ×2 IMPLANT
GOWN STRL REUS W/TWL LRG LVL3 (GOWN DISPOSABLE) ×2 IMPLANT
KIT TURNOVER KIT A (KITS) ×1 IMPLANT
MANIFOLD NEPTUNE II (INSTRUMENTS) ×1 IMPLANT
NDL HYPO 18GX1.5 BLUNT FILL (NEEDLE) ×1 IMPLANT
NDL HYPO 21X1.5 SAFETY (NEEDLE) ×1 IMPLANT
NEEDLE HYPO 18GX1.5 BLUNT FILL (NEEDLE) ×1 IMPLANT
NEEDLE HYPO 21X1.5 SAFETY (NEEDLE) ×1 IMPLANT
NS IRRIG 1000ML POUR BTL (IV SOLUTION) ×1 IMPLANT
PACK MINOR (CUSTOM PROCEDURE TRAY) ×1 IMPLANT
PAD ARMBOARD 7.5X6 YLW CONV (MISCELLANEOUS) ×1 IMPLANT
SET BASIN LINEN APH (SET/KITS/TRAYS/PACK) ×1 IMPLANT
SET LOCALIZER 20 PROBE US (MISCELLANEOUS) ×1 IMPLANT
SPONGE T-LAP 18X18 ~~LOC~~+RFID (SPONGE) ×1 IMPLANT
SUT MNCRL AB 4-0 PS2 18 (SUTURE) ×2 IMPLANT
SUT SILK 3 0 (SUTURE) ×1
SUT SILK 3-0 FS1 18XBRD (SUTURE) ×1 IMPLANT
SUT VIC AB 3-0 SH 27 (SUTURE) ×2
SUT VIC AB 3-0 SH 27X BRD (SUTURE) ×2 IMPLANT
SYR 20ML LL LF (SYRINGE) ×2 IMPLANT
SYR BULB IRRIG 60ML STRL (SYRINGE) ×1 IMPLANT

## 2022-08-20 NOTE — Interval H&P Note (Signed)
History and Physical Interval Note:  08/20/2022 7:20 AM  Amanda Riggs  has presented today for surgery, with the diagnosis of LEFT BREAST CANCER, DCIS.  The various methods of treatment have been discussed with the patient and family. After consideration of risks, benefits and other options for treatment, the patient has consented to  Procedure(s): PARTIAL MASTECTOMY WITH RADIO FREQUENCY LOCALIZER (Left) as a surgical intervention.  The patient's history has been reviewed, patient examined, no change in status, stable for surgery.  I have reviewed the patient's chart and labs.  Questions were answered to the patient's satisfaction.     Aviva Signs

## 2022-08-20 NOTE — Transfer of Care (Signed)
Immediate Anesthesia Transfer of Care Note  Patient: Amanda Riggs  Procedure(s) Performed: PARTIAL MASTECTOMY WITH RADIO FREQUENCY LOCALIZER (Left: Breast)  Patient Location: PACU  Anesthesia Type:General  Level of Consciousness: awake  Airway & Oxygen Therapy: Patient Spontanous Breathing and Patient connected to nasal cannula oxygen  Post-op Assessment: Report given to RN and Post -op Vital signs reviewed and stable  Post vital signs: Reviewed and stable  Last Vitals:  Vitals Value Taken Time  BP 127/63   Temp 97.5   Pulse 69 08/20/22 0831  Resp 14 08/20/22 0831  SpO2 100 % 08/20/22 0831  Vitals shown include unvalidated device data.  Last Pain:  Vitals:   08/20/22 0645  TempSrc: Oral  PainSc:          Complications: No notable events documented.

## 2022-08-20 NOTE — Anesthesia Preprocedure Evaluation (Signed)
Anesthesia Evaluation  Patient identified by MRN, date of birth, ID band Patient awake    Reviewed: Allergy & Precautions, H&P , NPO status , Patient's Chart, lab work & pertinent test results, reviewed documented beta blocker date and time   Airway Mallampati: II  TM Distance: >3 FB Neck ROM: full    Dental no notable dental hx.    Pulmonary neg pulmonary ROS   Pulmonary exam normal breath sounds clear to auscultation       Cardiovascular Exercise Tolerance: Good hypertension, negative cardio ROS  Rhythm:regular Rate:Normal     Neuro/Psych  PSYCHIATRIC DISORDERS Anxiety Depression    negative neurological ROS  negative psych ROS   GI/Hepatic negative GI ROS, Neg liver ROS,GERD  ,,  Endo/Other  negative endocrine ROS    Renal/GU Renal diseasenegative Renal ROS  negative genitourinary   Musculoskeletal   Abdominal   Peds  Hematology negative hematology ROS (+)   Anesthesia Other Findings   Reproductive/Obstetrics negative OB ROS                             Anesthesia Physical Anesthesia Plan  ASA: 2  Anesthesia Plan: General and General LMA   Post-op Pain Management:    Induction:   PONV Risk Score and Plan: Ondansetron  Airway Management Planned:   Additional Equipment:   Intra-op Plan:   Post-operative Plan:   Informed Consent: I have reviewed the patients History and Physical, chart, labs and discussed the procedure including the risks, benefits and alternatives for the proposed anesthesia with the patient or authorized representative who has indicated his/her understanding and acceptance.     Dental Advisory Given  Plan Discussed with: CRNA  Anesthesia Plan Comments:        Anesthesia Quick Evaluation

## 2022-08-20 NOTE — Op Note (Signed)
Patient:  Amanda Riggs  DOB:  [birth year]  MRN:  [id number]   Preop Diagnosis: Ductal carcinoma in situ, left breast  Postop Diagnosis: Same  Procedure: Left partial mastectomy after radiofrequency tag placement  Surgeon: Aviva Signs, MD  Anes: General  Indications: Patient is a 77 year old black female with DCIS of the left breast who now presents for a partial mastectomy.  The risks and benefits of the procedure including bleeding, infection, and the possibility of needing further surgery due to involved margins were fully explained to the patient, who gave informed consent.  Procedure note: The patient was placed in the supine position.  After general anesthesia was administered, the left breast was prepped and draped using usual sterile technique with ChloraPrep.  Surgical site confirmation was performed.  Using the localizer, an incision was made transversely along the medial aspect of the left breast.  The tag was localized and a generous portion of normal-appearing tissue was excised around it.  This did extend towards the previous biopsy site and the breast tissue that was negative for cancer in the past.  A short suture was placed superiorly and a long suture placed laterally for orientation purposes.  Specimen radiography revealed the ribbon and tag to be centrally located in the specimen.  The specimen was then sent to pathology further examination.  A bleeding was controlled using Bovie electrocautery.  Exparel was instilled into the surrounding wound.  The subcutaneous layer was reapproximated using a 3-0 Vicryl interrupted suture.  The skin was closed using a 4-0 Monocryl subcuticular suture.  Dermabond was applied.  All tape and needle counts were correct at the end of the procedure.  The patient was awakened and transferred to PACU in stable condition.  Complications: None  EBL: Minimal  Specimen: Left breast tissue

## 2022-08-20 NOTE — Anesthesia Procedure Notes (Signed)
Procedure Name: LMA Insertion Date/Time: 08/20/2022 7:35 AM  Performed by: Karna Dupes, CRNAPre-anesthesia Checklist: Emergency Drugs available, Patient identified, Suction available and Patient being monitored Patient Re-evaluated:Patient Re-evaluated prior to induction Oxygen Delivery Method: Circle system utilized Preoxygenation: Pre-oxygenation with 100% oxygen Induction Type: IV induction LMA: LMA inserted LMA Size: 3.0 Number of attempts: 2 Placement Confirmation: positive ETCO2 and breath sounds checked- equal and bilateral Tube secured with: Tape Dental Injury: Teeth and Oropharynx as per pre-operative assessment

## 2022-08-21 NOTE — Anesthesia Postprocedure Evaluation (Signed)
Anesthesia Post Note  Patient: Amanda Riggs  Procedure(s) Performed: PARTIAL MASTECTOMY WITH RADIO FREQUENCY LOCALIZER (Left: Breast)  Patient location during evaluation: Phase II Anesthesia Type: General Level of consciousness: awake Pain management: pain level controlled Vital Signs Assessment: post-procedure vital signs reviewed and stable Respiratory status: spontaneous breathing and respiratory function stable Cardiovascular status: blood pressure returned to baseline and stable Postop Assessment: no headache and no apparent nausea or vomiting Anesthetic complications: no Comments: Late entry   No notable events documented.   Last Vitals:  Vitals:   08/20/22 0853 08/20/22 0908  BP: 127/60 128/62  Pulse: 71 66  Resp: 14 18  Temp: 36.6 C 36.6 C  SpO2: 100% 100%    Last Pain:  Vitals:   08/20/22 0908  TempSrc: Oral  PainSc: [place]

## 2022-08-22 LAB — SURGICAL PATHOLOGY

## 2022-08-26 ENCOUNTER — Encounter: Payer: Self-pay | Admitting: General Surgery

## 2022-08-26 ENCOUNTER — Ambulatory Visit (INDEPENDENT_AMBULATORY_CARE_PROVIDER_SITE_OTHER): Payer: Medicare Other | Admitting: General Surgery

## 2022-08-26 VITALS — BP 133/83 | HR 76 | Temp 98.1°F | Resp 12 | Ht 59.0 in | Wt 119.0 lb

## 2022-08-26 DIAGNOSIS — Z09 Encounter for follow-up examination after completed treatment for conditions other than malignant neoplasm: Secondary | ICD-10-CM

## 2022-08-26 NOTE — Progress Notes (Signed)
Subjective:     Amanda Riggs  Patient here for postoperative visit, status post left partial mastectomy for ductal carcinoma in situ.  Patient is doing well.  She has no complaints. Objective:    BP 133/83   Pulse 76   Temp 98.1 F (36.7 C) (Oral)   Resp 12   Ht 4' 11"$  (1.499 m)   Wt 119 lb (54 kg)   SpO2 98%   BMI 24.04 kg/m   General:  alert, cooperative, and no distress  Left breast incision healing well. Final pathology reveals no residual ductal carcinoma in situ present.     Assessment:    Doing well postoperatively.    Plan:   Will refer to oncology for further management and treatment.  Follow-up here as needed.

## 2022-09-03 ENCOUNTER — Inpatient Hospital Stay: Payer: Medicare Other | Attending: Hematology | Admitting: Hematology

## 2022-09-03 VITALS — BP 130/66 | HR 73 | Temp 98.2°F | Resp 17 | Ht 60.0 in | Wt 118.2 lb

## 2022-09-03 DIAGNOSIS — Z90710 Acquired absence of both cervix and uterus: Secondary | ICD-10-CM | POA: Insufficient documentation

## 2022-09-03 DIAGNOSIS — Z800 Family history of malignant neoplasm of digestive organs: Secondary | ICD-10-CM | POA: Diagnosis not present

## 2022-09-03 DIAGNOSIS — Z79811 Long term (current) use of aromatase inhibitors: Secondary | ICD-10-CM | POA: Diagnosis not present

## 2022-09-03 DIAGNOSIS — D0512 Intraductal carcinoma in situ of left breast: Secondary | ICD-10-CM | POA: Diagnosis not present

## 2022-09-03 DIAGNOSIS — Z09 Encounter for follow-up examination after completed treatment for conditions other than malignant neoplasm: Secondary | ICD-10-CM

## 2022-09-03 DIAGNOSIS — E559 Vitamin D deficiency, unspecified: Secondary | ICD-10-CM

## 2022-09-03 MED ORDER — ANASTROZOLE 1 MG PO TABS: 1.0000 mg | ORAL_TABLET | Freq: Every day | ORAL | 1 refills | Status: AC

## 2022-09-03 NOTE — Progress Notes (Signed)
[facility] [address], [place] [address]   Clinic Day:  09/03/2022  Referring physician: Glenda Chroman, MD  Patient Care Team: Glenda Chroman, MD as PCP - General (Internal Medicine) Derek Jack, MD as Medical Oncologist (Medical Oncology) Brien Mates, RN as Oncology Nurse Navigator (Medical Oncology)   ASSESSMENT & PLAN:   Assessment:  1.  Left breast DCIS, intermediate grade, ER/PR positive: - Diagnostic mammogram (04/09/2022): Persistent subtle distortion seen in the slightly outer left breast. - Left breast medial biopsy (04/22/2022): Focal DCIS, intermediate grade, ER 95%, PR 70%.  Left breast upper outer biopsy: Complex sclerosing lesion with UDH. - Left breast lumpectomy (06/18/2022): Complex sclerosing lesion with UDH.  Fibrocystic change and small incidental intraductal papilloma. - Left breast partial mastectomy LIQ (08/20/2022): Complex sclerosing lesions with UDH, fibrocystic changes, intraductal papillomas.  No residual DCIS or invasive malignancy. - She had no previous biopsies.  She was on HRT for 3 years.  2.  Social/family history: - Lives with husband at home.  Independent of ADLs and ADLs.  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  Non-smoker. - 3 of her brothers had prostate cancer.  1 maternal aunt had colon cancer and 1 paternal aunt had colon cancer.   Plan:  1.  Left breast DCIS, intermediate grade, ER/PR positive: - We talked about pathology reports in detail. - Recommend antiestrogen therapy with anastrozole for 5 years to decrease incidence of ipsilateral and contralateral invasive cancer and DCIS. - Recommend evaluation by radiation oncology. - We discussed side effects of anastrozole in detail. - RTC 3 months for follow-up.  If she is tolerating well at that time, she will return to clinic every 6 months for 5 years.  She will continue yearly mammograms.  2.  Bone health: - Recommend baseline  bone density test and vitamin D levels.  Orders Placed This Encounter  Procedures   DG Bone Density    No medical devices per office No CT or MRI upcoming per office Sch'd appt with office on 09/03/22-kjones    Standing Status:   Future    Standing Expiration Date:   09/03/2023    Order Specific Question:   Reason for Exam (SYMPTOM  OR DIAGNOSIS REQUIRED)    Answer:   AI therapy evaluation    Order Specific Question:   Preferred imaging location?    Answer:   [facility]    Order Specific Question:   Release to patient    Answer:   Immediate   Vitamin D 25 hydroxy    Standing Status:   Future    Standing Expiration Date:   09/03/2023   CBC with Differential    Standing Status:   Future    Standing Expiration Date:   09/03/2023   Comprehensive metabolic panel    Standing Status:   Future    Standing Expiration Date:   09/03/2023   Ambulatory referral to Genetics    Referral Priority:   Routine    Referral Type:   Consultation    Referral Reason:   Specialty Services Required    Number of Visits Requested:   1      Kaleen Odea as a scribe for Derek Jack, MD.,have documented all relevant documentation on the behalf of Derek Jack, MD,as directed by  Derek Jack, MD while in the presence of Derek Jack, MD.   I, Derek Jack MD, have reviewed the above documentation for accuracy and completeness,  and I agree with the above.   Derek Jack, MD   2/21/20244:59 PM  CHIEF COMPLAINT/PURPOSE OF CONSULT:   Diagnosis: Left breast cancer   Cancer Staging  Ductal carcinoma in situ (DCIS) of left breast Staging form: Breast, AJCC 8th Edition - Clinical stage from 09/03/2022: Stage 0 (cTis (DCIS), cN0, cM0, ER+, PR+, HER2: Not Assessed) - Unsigned    Prior Therapy: Left breast lumpectomy  Current Therapy: Anastrozole   HISTORY OF PRESENT ILLNESS:   Oncology History   No history exists.      Amanda Riggs is a 77  y.o. female presenting to clinic today for evaluation of Left breast cancer at the request of Dr.Mark Arnoldo Morale.  On 08/20/2022 she had an Left partial mastectomy after radiofrequency tag placement.  Today, she states that she is doing well overall. Her appetite level is at 100%. Her energy level is at 85%. She has 5/10 left breast pain.She states the way she's feeling today she felt like this since October of last year. She also received her first biopsy in October. She menarche at 44-14 yrs old. She denies ever being on birth control. She had her first and only child at the age of 89.She received a hysterectomy at the age of 70 due to fibroid tumor. She was on HRT for 3 years for vasomotor symptoms..   She has no history of breath cancer.Her maternal and paternal aunt had colon cancer. Her Older, Middle, and Younger brother had prostate cancer.   She lives with her husband, son,and grandson. She stills able to complete daily activities.She denies her arthritis. She denies ever having a heart attack an strokes.  She had bone density for approximately.  She denies smoking.  She is a retired Agricultural consultant.   PAST MEDICAL HISTORY:   Past Medical History: Past Medical History:  Diagnosis Date   Anxiety    Chronic kidney disease    Depression    GERD (gastroesophageal reflux disease)    HOH (hard of hearing)    Hyperlipidemia    Hypertension    OA (osteoarthritis)    Subconjunctival hemorrhage     Surgical History: Past Surgical History:  Procedure Laterality Date   ABDOMINAL HYSTERECTOMY     BREAST BIOPSY  06/17/2022   MM LT RADIO FREQUENCY TAG EA ADD LESION LOC MAMMO GUIDE 06/17/2022 [facility]   BREAST LUMPECTOMY WITH RADIOFREQUENCY TAG IDENTIFICATION Left [id number]   Procedure: BREAST LUMPECTOMY WITH RADIOFREQUENCY TAG IDENTIFICATION;  Surgeon: Aviva Signs, MD;  Location: [facility];  Service: General;  Laterality: Left;    Social History: Social History   Socioeconomic History    Marital status: Married    Spouse name: Not on file   Number of children: Not on file   Years of education: Not on file   Highest education level: Not on file  Occupational History   Not on file  Tobacco Use   Smoking status: Never    Passive exposure: Never   Smokeless tobacco: Never  Vaping Use   Vaping Use: Never used  Substance and Sexual Activity   Alcohol use: Not Currently   Drug use: Not Currently   Sexual activity: Yes    Birth control/protection: Post-menopausal  Other Topics Concern   Not on file  Social History Narrative   Not on file   Social Determinants of Health   Financial Resource Strain: Not on file  Food Insecurity: No Food Insecurity (09/03/2022)   Hunger Vital Sign    Worried About Running Out of Food  in the Last Year: Never true    [place] in the Last Year: Never true  Transportation Needs: No Transportation Needs (09/03/2022)   PRAPARE - Hydrologist (Medical): No    Lack of Transportation (Non-Medical): No  Physical Activity: Not on file  Stress: Not on file  Social Connections: Not on file  Intimate Partner Violence: Not At Risk (09/03/2022)   Humiliation, Afraid, Rape, and Kick questionnaire    Fear of Current or Ex-Partner: No    Emotionally Abused: No    Physically Abused: No    Sexually Abused: No    Family History: Family History  Problem Relation Age of Onset   Diabetes Mother    Heart disease Mother    Heart disease Father    Alcohol abuse Father    Stroke Father    Breast cancer Neg Hx     Current Medications:  Current Outpatient Medications:    anastrozole (ARIMIDEX) 1 MG tablet, Take 1 tablet (1 mg total) by mouth daily., Disp: 90 tablet, Rfl: 1   aspirin EC 81 MG tablet, Take 81 mg by mouth every other day. Swallow whole., Disp: , Rfl:    busPIRone (BUSPAR) 5 MG tablet, Take 5 mg by mouth daily as needed (anxiety)., Disp: , Rfl:    cyanocobalamin (VITAMIN B12) 1000 MCG tablet, Take  1,000 mcg by mouth daily., Disp: , Rfl:    Multiple Vitamin (MULTIVITAMIN WITH MINERALS) TABS tablet, Take 1 tablet by mouth daily., Disp: , Rfl:    pravastatin (PRAVACHOL) 40 MG tablet, Take 40 mg by mouth once a week., Disp: , Rfl:    traMADol (ULTRAM) 50 MG tablet, Take 1 tablet (50 mg total) by mouth every 6 (six) hours as needed., Disp: 15 tablet, Rfl: 0   triamterene-hydrochlorothiazide (MAXZIDE-25) 37.5-25 MG tablet, Take 1 tablet by mouth daily., Disp: , Rfl:    Allergies: No Known Allergies  REVIEW OF SYSTEMS:   Review of Systems  Constitutional:  Negative for chills, fatigue and fever.  HENT:   Negative for lump/mass, mouth sores, nosebleeds, sore throat and trouble swallowing.   Eyes:  Negative for eye problems.  Respiratory:  Negative for cough and shortness of breath.   Cardiovascular:  Positive for palpitations. Negative for chest pain and leg swelling.  Gastrointestinal:  Negative for abdominal pain, constipation, diarrhea, nausea and vomiting.  Genitourinary:  Negative for bladder incontinence, difficulty urinating, dysuria, frequency, hematuria and nocturia.   Musculoskeletal:  Negative for arthralgias, back pain, flank pain, myalgias and neck pain.  Skin:  Negative for itching and rash.  Neurological:  Negative for dizziness, headaches and numbness.  Hematological:  Does not bruise/bleed easily.  Psychiatric/Behavioral:  Negative for depression, sleep disturbance and suicidal ideas. The patient is nervous/anxious.   All other systems reviewed and are negative.    VITALS:   Blood pressure 130/66, pulse 73, temperature 98.2 F (36.8 C), temperature source Oral, resp. rate 17, height 5' (1.524 m), weight 118 lb 3.2 oz (53.6 kg), SpO2 100 %.  Wt Readings from Last 3 Encounters:  09/03/22 118 lb 3.2 oz (53.6 kg)  08/26/22 119 lb (54 kg)  08/20/22 119 lb 14.9 oz (54.4 kg)    Body mass index is 23.08 kg/m.  Performance status (ECOG): 1 - Symptomatic but completely  ambulatory  PHYSICAL EXAM:   Physical Exam Vitals and nursing note reviewed. Exam conducted with a chaperone present.  Constitutional:      Appearance: Normal appearance.  Cardiovascular:     Rate and Rhythm: Normal rate and regular rhythm.     Pulses: Normal pulses.     Heart sounds: Normal heart sounds.  Pulmonary:     Effort: Pulmonary effort is normal.     Breath sounds: Normal breath sounds.  Chest:     Comments: Lumpectomy scar LIQ healed well Lumpectomy scar UIQ healed well Abdominal:     Palpations: Abdomen is soft. There is no hepatomegaly, splenomegaly or mass.     Tenderness: There is no abdominal tenderness.  Musculoskeletal:     Right lower leg: No edema.     Left lower leg: No edema.  Lymphadenopathy:     Cervical: No cervical adenopathy.     Right cervical: No superficial, deep or posterior cervical adenopathy.    Left cervical: No superficial, deep or posterior cervical adenopathy.     Upper Body:     Right upper body: No supraclavicular or axillary adenopathy.     Left upper body: No supraclavicular or axillary adenopathy.  Neurological:     General: No focal deficit present.     Mental Status: She is alert and oriented to person, place, and time.  Psychiatric:        Mood and Affect: Mood normal.        Behavior: Behavior normal.     LABS:      Latest Ref Rng & Units 06/16/2022   11:17 AM  CBC  WBC 4.0 - 10.5 K/uL 5.9   Hemoglobin 12.0 - 15.0 g/dL 12.1   Hematocrit 36.0 - 46.0 % 37.8   Platelets 150 - 400 K/uL 189       Latest Ref Rng & Units 06/16/2022   11:17 AM  CMP  Glucose 70 - 99 mg/dL 89   BUN 8 - 23 mg/dL 17   Creatinine 0.44 - 1.00 mg/dL 0.86   Sodium 135 - 145 mmol/L 137   Potassium 3.5 - 5.1 mmol/L 3.8   Chloride 98 - 111 mmol/L 105   CO2 22 - 32 mmol/L 25   Calcium 8.9 - 10.3 mg/dL 9.2   Total Protein 6.5 - 8.1 g/dL 7.0   Total Bilirubin 0.3 - 1.2 mg/dL 0.8   Alkaline Phos 38 - 126 U/L 52   AST 15 - 41 U/L 20   ALT 0 - 44  U/L 16      No results found for: "CEA1", "CEA" / No results found for: "CEA1", "CEA" No results found for: "PSA1" No results found for: "[id number]" No results found for: "CAN125"  No results found for: "TOTALPROTELP", "ALBUMINELP", "A1GS", "A2GS", "BETS", "BETA2SER", "GAMS", "MSPIKE", "SPEI" No results found for: "TIBC", "FERRITIN", "IRONPCTSAT" No results found for: "LDH"   STUDIES:   MM BREAST SURGICAL SPECIMEN  Result Date: 08/20/2022 CLINICAL DATA:  Status post radiofrequency tag localized LEFT breast lumpectomy. EXAM: SPECIMEN RADIOGRAPH OF THE LEFT BREAST COMPARISON:  Previous exam(s). FINDINGS: Status post excision of the LEFT breast. The radiofrequency tag and ribbon shaped clip are present within the specimen. An additional X shaped clip in the outer LEFT breast corresponds to a biopsy-proven complex sclerosing lesion which was also localized with a radiofrequency tag. This X shaped clip and adjacent radiofrequency tag will not be excised, per Dr. Arnoldo Morale. IMPRESSION: Specimen radiograph of the LEFT breast. Radiofrequency tag and ribbon shaped clip are present within the specimen. Electronically Signed   By: Franki Cabot M.D.   On: 08/20/2022 08:25

## 2022-09-03 NOTE — Patient Instructions (Addendum)
[facility]  Discharge Instructions  You were seen and examined today by Dr. Delton Coombes. Dr. Delton Coombes is a medical oncologist, meaning that he specializes in the treatment of cancer diagnoses. Dr. Delton Coombes discussed your past medical history, family history of cancers, and the events that led to you being here today.  You were referred to Dr. Delton Coombes due to a recent diagnosis of ductal carcinoma in situ (DCIS). This is a type of pre-breast cancer that does place you at a higher risk for developing breast cancers.  Dr. Delton Coombes has recommended taking an anti-estrogen pill once daily for at least 5 years to prevent development of breast cancer. The most common side effects of this include hot flashes and muscle/joint aches which subside after the first few months.  Dr. Delton Coombes has requested a bone density scan to establish a baseline of your bone health. With ongoing exposure of anti-estrogen pills, your bones can become weak. Please take Vitamin D and Calcium supplements daily.  Dr. Delton Coombes will also refer you to [facility] to discuss the need for radiation to the impacted breast.  Continue yearly mammograms.  Follow-up as scheduled.  Thank you for choosing [facility] to provide your oncology and hematology care.   To afford each patient quality time with our provider, please arrive at least 15 minutes before your scheduled appointment time. You may need to reschedule your appointment if you arrive late (10 or more minutes). Arriving late affects you and other patients whose appointments are after yours.  Also, if you miss three or more appointments without notifying the office, you may be dismissed from the clinic at the provider's discretion.    Again, thank you for choosing [facility].  Our hope is that these requests will decrease the amount of time that you wait before being seen by  our physicians.   If you have a lab appointment with the [facility] please come in thru the Main Entrance and check in at the main information desk.           _____________________________________________________________  Should you have questions after your visit to [facility], please contact our office at [phone] and follow the prompts.  Our office hours are 8:00 a.m. to 4:30 p.m. Monday - Thursday and 8:00 a.m. to 2:30 p.m. Friday.  Please note that voicemails left after 4:00 p.m. may not be returned until the following business day.  We are closed weekends and all major holidays.  You do have access to a nurse 24-7, just call the main number to the clinic [phone] and do not press any options, hold on the line and a nurse will answer the phone.    For prescription refill requests, have your pharmacy contact our office and allow 72 hours.    Masks are optional in the cancer centers. If you would like for your care team to wear a mask while they are taking care of you, please let them know. You may have one support person who is at least 76 years old accompany you for your appointments.

## 2022-09-05 DIAGNOSIS — E7800 Pure hypercholesterolemia, unspecified: Secondary | ICD-10-CM | POA: Diagnosis not present

## 2022-09-05 DIAGNOSIS — D692 Other nonthrombocytopenic purpura: Secondary | ICD-10-CM | POA: Diagnosis not present

## 2022-09-05 DIAGNOSIS — C801 Malignant (primary) neoplasm, unspecified: Secondary | ICD-10-CM | POA: Diagnosis not present

## 2022-09-05 DIAGNOSIS — I10 Essential (primary) hypertension: Secondary | ICD-10-CM | POA: Diagnosis not present

## 2022-09-05 DIAGNOSIS — Z299 Encounter for prophylactic measures, unspecified: Secondary | ICD-10-CM | POA: Diagnosis not present

## 2022-09-12 DIAGNOSIS — Z9221 Personal history of antineoplastic chemotherapy: Secondary | ICD-10-CM | POA: Diagnosis not present

## 2022-09-12 DIAGNOSIS — D0512 Intraductal carcinoma in situ of left breast: Secondary | ICD-10-CM | POA: Diagnosis not present

## 2022-09-18 ENCOUNTER — Ambulatory Visit (INDEPENDENT_AMBULATORY_CARE_PROVIDER_SITE_OTHER): Payer: Medicare Other | Admitting: General Surgery

## 2022-09-18 ENCOUNTER — Encounter: Payer: Self-pay | Admitting: General Surgery

## 2022-09-18 VITALS — BP 128/79 | HR 63 | Temp 98.1°F | Resp 12 | Ht 60.0 in | Wt 118.0 lb

## 2022-09-18 DIAGNOSIS — Z09 Encounter for follow-up examination after completed treatment for conditions other than malignant neoplasm: Secondary | ICD-10-CM

## 2022-09-18 NOTE — Progress Notes (Signed)
Subjective:     Amanda Riggs  Patient returns for wound check.  She has developed swelling at the left partial mastectomy site.  It is uncomfortable.  She denies any fevers.  No drainage has been noted from the incision. Objective:    BP 128/79   Pulse 63   Temp 98.1 F (36.7 C) (Oral)   Resp 12   Ht 5' (1.524 m)   Wt 118 lb (53.5 kg)   SpO2 99%   BMI 23.05 kg/m   General:  alert, cooperative, and no distress  Left breast incision healing well.  She does have a seroma along the medial aspect of the left breast.  40 cc of serosanguineous fluid was aspirated.  This resolved the seroma.     Assessment:    Doing well postoperatively. Postoperative seroma    Plan:   I told the patient to return in 5 days should the seroma recur.  She understands and agrees.

## 2022-09-23 ENCOUNTER — Encounter: Payer: Self-pay | Admitting: General Surgery

## 2022-09-23 ENCOUNTER — Ambulatory Visit (INDEPENDENT_AMBULATORY_CARE_PROVIDER_SITE_OTHER): Payer: Medicare Other | Admitting: General Surgery

## 2022-09-23 VITALS — BP 122/77 | HR 66 | Temp 97.8°F | Resp 12 | Ht 60.0 in | Wt 118.0 lb

## 2022-09-23 DIAGNOSIS — Z09 Encounter for follow-up examination after completed treatment for conditions other than malignant neoplasm: Secondary | ICD-10-CM

## 2022-09-24 DIAGNOSIS — R922 Inconclusive mammogram: Secondary | ICD-10-CM | POA: Diagnosis not present

## 2022-09-24 DIAGNOSIS — D0512 Intraductal carcinoma in situ of left breast: Secondary | ICD-10-CM | POA: Diagnosis not present

## 2022-09-24 NOTE — Progress Notes (Signed)
Subjective:     Amanda Riggs  Here for postoperative check of left partial mastectomy seroma.  Patient states she feels much better.  She has not had recurrence of her swelling. Objective:    BP 122/77   Pulse 66   Temp 97.8 F (36.6 C) (Oral)   Resp 12   Ht 5' (1.524 m)   Wt 118 lb (53.5 kg)   SpO2 98%   BMI 23.05 kg/m   General:  alert, cooperative, and no distress  Left breast with both incisions healing well.  The seroma has not recurred.  No tension noted in the left breast.     Assessment:    Doing well postoperatively. Seroma, resolved    Plan:   Okay for planned follow-up mammogram tomorrow.  Follow-up here as needed.

## 2022-09-25 DIAGNOSIS — D0512 Intraductal carcinoma in situ of left breast: Secondary | ICD-10-CM | POA: Diagnosis not present

## 2022-09-26 DIAGNOSIS — Z9221 Personal history of antineoplastic chemotherapy: Secondary | ICD-10-CM | POA: Diagnosis not present

## 2022-09-26 DIAGNOSIS — D0512 Intraductal carcinoma in situ of left breast: Secondary | ICD-10-CM | POA: Diagnosis not present

## 2022-10-02 DIAGNOSIS — Z9221 Personal history of antineoplastic chemotherapy: Secondary | ICD-10-CM | POA: Diagnosis not present

## 2022-10-02 DIAGNOSIS — D0512 Intraductal carcinoma in situ of left breast: Secondary | ICD-10-CM | POA: Diagnosis not present

## 2022-10-13 DIAGNOSIS — Z9221 Personal history of antineoplastic chemotherapy: Secondary | ICD-10-CM | POA: Diagnosis not present

## 2022-10-13 DIAGNOSIS — D0512 Intraductal carcinoma in situ of left breast: Secondary | ICD-10-CM | POA: Diagnosis not present

## 2022-10-14 DIAGNOSIS — D0512 Intraductal carcinoma in situ of left breast: Secondary | ICD-10-CM | POA: Diagnosis not present

## 2022-10-14 DIAGNOSIS — Z9221 Personal history of antineoplastic chemotherapy: Secondary | ICD-10-CM | POA: Diagnosis not present

## 2022-10-15 DIAGNOSIS — Z9221 Personal history of antineoplastic chemotherapy: Secondary | ICD-10-CM | POA: Diagnosis not present

## 2022-10-15 DIAGNOSIS — D0512 Intraductal carcinoma in situ of left breast: Secondary | ICD-10-CM | POA: Diagnosis not present

## 2022-10-16 DIAGNOSIS — Z9221 Personal history of antineoplastic chemotherapy: Secondary | ICD-10-CM | POA: Diagnosis not present

## 2022-10-16 DIAGNOSIS — D0512 Intraductal carcinoma in situ of left breast: Secondary | ICD-10-CM | POA: Diagnosis not present

## 2022-10-17 DIAGNOSIS — Z9221 Personal history of antineoplastic chemotherapy: Secondary | ICD-10-CM | POA: Diagnosis not present

## 2022-10-17 DIAGNOSIS — D0512 Intraductal carcinoma in situ of left breast: Secondary | ICD-10-CM | POA: Diagnosis not present

## 2022-10-20 DIAGNOSIS — Z9221 Personal history of antineoplastic chemotherapy: Secondary | ICD-10-CM | POA: Diagnosis not present

## 2022-10-20 DIAGNOSIS — D0512 Intraductal carcinoma in situ of left breast: Secondary | ICD-10-CM | POA: Diagnosis not present

## 2022-10-21 DIAGNOSIS — Z9221 Personal history of antineoplastic chemotherapy: Secondary | ICD-10-CM | POA: Diagnosis not present

## 2022-10-21 DIAGNOSIS — D0512 Intraductal carcinoma in situ of left breast: Secondary | ICD-10-CM | POA: Diagnosis not present

## 2022-10-22 DIAGNOSIS — Z9221 Personal history of antineoplastic chemotherapy: Secondary | ICD-10-CM | POA: Diagnosis not present

## 2022-10-22 DIAGNOSIS — D0512 Intraductal carcinoma in situ of left breast: Secondary | ICD-10-CM | POA: Diagnosis not present

## 2022-10-23 DIAGNOSIS — Z9221 Personal history of antineoplastic chemotherapy: Secondary | ICD-10-CM | POA: Diagnosis not present

## 2022-10-23 DIAGNOSIS — D0512 Intraductal carcinoma in situ of left breast: Secondary | ICD-10-CM | POA: Diagnosis not present

## 2022-10-24 DIAGNOSIS — Z9221 Personal history of antineoplastic chemotherapy: Secondary | ICD-10-CM | POA: Diagnosis not present

## 2022-10-24 DIAGNOSIS — D0512 Intraductal carcinoma in situ of left breast: Secondary | ICD-10-CM | POA: Diagnosis not present

## 2022-10-27 DIAGNOSIS — Z9221 Personal history of antineoplastic chemotherapy: Secondary | ICD-10-CM | POA: Diagnosis not present

## 2022-10-27 DIAGNOSIS — D0512 Intraductal carcinoma in situ of left breast: Secondary | ICD-10-CM | POA: Diagnosis not present

## 2022-10-28 DIAGNOSIS — Z9221 Personal history of antineoplastic chemotherapy: Secondary | ICD-10-CM | POA: Diagnosis not present

## 2022-10-28 DIAGNOSIS — D0512 Intraductal carcinoma in situ of left breast: Secondary | ICD-10-CM | POA: Diagnosis not present

## 2022-10-29 DIAGNOSIS — Z9221 Personal history of antineoplastic chemotherapy: Secondary | ICD-10-CM | POA: Diagnosis not present

## 2022-10-29 DIAGNOSIS — D0512 Intraductal carcinoma in situ of left breast: Secondary | ICD-10-CM | POA: Diagnosis not present

## 2022-10-30 DIAGNOSIS — D0512 Intraductal carcinoma in situ of left breast: Secondary | ICD-10-CM | POA: Diagnosis not present

## 2022-10-30 DIAGNOSIS — Z9221 Personal history of antineoplastic chemotherapy: Secondary | ICD-10-CM | POA: Diagnosis not present

## 2022-10-31 DIAGNOSIS — Z9221 Personal history of antineoplastic chemotherapy: Secondary | ICD-10-CM | POA: Diagnosis not present

## 2022-10-31 DIAGNOSIS — D0512 Intraductal carcinoma in situ of left breast: Secondary | ICD-10-CM | POA: Diagnosis not present

## 2022-11-03 DIAGNOSIS — Z9221 Personal history of antineoplastic chemotherapy: Secondary | ICD-10-CM | POA: Diagnosis not present

## 2022-11-03 DIAGNOSIS — D0512 Intraductal carcinoma in situ of left breast: Secondary | ICD-10-CM | POA: Diagnosis not present

## 2022-11-04 DIAGNOSIS — Z9221 Personal history of antineoplastic chemotherapy: Secondary | ICD-10-CM | POA: Diagnosis not present

## 2022-11-04 DIAGNOSIS — D0512 Intraductal carcinoma in situ of left breast: Secondary | ICD-10-CM | POA: Diagnosis not present

## 2022-11-05 DIAGNOSIS — D0512 Intraductal carcinoma in situ of left breast: Secondary | ICD-10-CM | POA: Diagnosis not present

## 2022-11-05 DIAGNOSIS — Z9221 Personal history of antineoplastic chemotherapy: Secondary | ICD-10-CM | POA: Diagnosis not present

## 2022-11-06 DIAGNOSIS — Z9221 Personal history of antineoplastic chemotherapy: Secondary | ICD-10-CM | POA: Diagnosis not present

## 2022-11-06 DIAGNOSIS — D0512 Intraductal carcinoma in situ of left breast: Secondary | ICD-10-CM | POA: Diagnosis not present

## 2022-11-07 DIAGNOSIS — Z9221 Personal history of antineoplastic chemotherapy: Secondary | ICD-10-CM | POA: Diagnosis not present

## 2022-11-07 DIAGNOSIS — D0512 Intraductal carcinoma in situ of left breast: Secondary | ICD-10-CM | POA: Diagnosis not present

## 2022-11-10 DIAGNOSIS — D0512 Intraductal carcinoma in situ of left breast: Secondary | ICD-10-CM | POA: Diagnosis not present

## 2022-11-10 DIAGNOSIS — Z9221 Personal history of antineoplastic chemotherapy: Secondary | ICD-10-CM | POA: Diagnosis not present

## 2022-11-11 DIAGNOSIS — Z9221 Personal history of antineoplastic chemotherapy: Secondary | ICD-10-CM | POA: Diagnosis not present

## 2022-11-11 DIAGNOSIS — D0512 Intraductal carcinoma in situ of left breast: Secondary | ICD-10-CM | POA: Diagnosis not present

## 2022-11-20 ENCOUNTER — Inpatient Hospital Stay: Payer: Medicare Other

## 2022-11-20 ENCOUNTER — Inpatient Hospital Stay: Payer: Medicare Other | Attending: Hematology | Admitting: Licensed Clinical Social Worker

## 2022-11-20 ENCOUNTER — Encounter: Payer: Self-pay | Admitting: Licensed Clinical Social Worker

## 2022-11-20 DIAGNOSIS — D0512 Intraductal carcinoma in situ of left breast: Secondary | ICD-10-CM | POA: Diagnosis not present

## 2022-11-20 DIAGNOSIS — Z8042 Family history of malignant neoplasm of prostate: Secondary | ICD-10-CM

## 2022-11-20 DIAGNOSIS — E559 Vitamin D deficiency, unspecified: Secondary | ICD-10-CM | POA: Diagnosis not present

## 2022-11-20 DIAGNOSIS — Z79811 Long term (current) use of aromatase inhibitors: Secondary | ICD-10-CM | POA: Insufficient documentation

## 2022-11-20 DIAGNOSIS — Z800 Family history of malignant neoplasm of digestive organs: Secondary | ICD-10-CM

## 2022-11-20 DIAGNOSIS — Z90710 Acquired absence of both cervix and uterus: Secondary | ICD-10-CM | POA: Insufficient documentation

## 2022-11-20 DIAGNOSIS — Z923 Personal history of irradiation: Secondary | ICD-10-CM | POA: Diagnosis not present

## 2022-11-20 LAB — COMPREHENSIVE METABOLIC PANEL
ALT: 17 U/L (ref 0–44)
AST: 20 U/L (ref 15–41)
Albumin: 4.2 g/dL (ref 3.5–5.0)
Alkaline Phosphatase: 68 U/L (ref 38–126)
Anion gap: 9 (ref 5–15)
BUN: 14 mg/dL (ref 8–23)
CO2: 28 mmol/L (ref 22–32)
Calcium: 9.5 mg/dL (ref 8.9–10.3)
Chloride: 100 mmol/L (ref 98–111)
Creatinine, Ser: 0.84 mg/dL (ref 0.44–1.00)
GFR, Estimated: >60 mL/min (ref >60)
Glucose, Bld: 97 mg/dL (ref 70–99)
Potassium: 4 mmol/L (ref 3.5–5.1)
Sodium: 137 mmol/L (ref 135–145)
Total Bilirubin: 1 mg/dL (ref 0.3–1.2)
Total Protein: 7.6 g/dL (ref 6.5–8.1)

## 2022-11-20 LAB — CBC WITH DIFFERENTIAL/PLATELET
Abs Immature Granulocytes: 0.01 10*3/uL (ref 0.00–0.07)
Basophils Absolute: 0.1 10*3/uL (ref 0.0–0.1)
Basophils Relative: 1 %
Eosinophils Absolute: 0.4 10*3/uL (ref 0.0–0.5)
Eosinophils Relative: 8 %
HCT: 41.9 % (ref 36.0–46.0)
Hemoglobin: 13.3 g/dL (ref 12.0–15.0)
Immature Granulocytes: 0 %
Lymphocytes Relative: 16 %
Lymphs Abs: 0.8 10*3/uL (ref 0.7–4.0)
MCH: 28.6 pg (ref 26.0–34.0)
MCHC: 31.7 g/dL (ref 30.0–36.0)
MCV: 90.1 fL (ref 80.0–100.0)
Monocytes Absolute: 0.4 10*3/uL (ref 0.1–1.0)
Monocytes Relative: 9 %
Neutro Abs: 3.1 10*3/uL (ref 1.7–7.7)
Neutrophils Relative %: 66 %
Platelets: 169 10*3/uL (ref 150–400)
RBC: 4.65 MIL/uL (ref 3.87–5.11)
RDW: 14 % (ref 11.5–15.5)
WBC: 4.7 10*3/uL (ref 4.0–10.5)
nRBC: 0 % (ref 0.0–0.2)

## 2022-11-20 LAB — GENETIC SCREENING ORDER

## 2022-11-20 LAB — VITAMIN D 25 HYDROXY (VIT D DEFICIENCY, FRACTURES): Vit D, 25-Hydroxy: 13.44 ng/mL — ABNORMAL LOW (ref 30–100)

## 2022-11-20 NOTE — Progress Notes (Signed)
REFERRING PROVIDER: Doreatha Massed, MD [address] [place],  [place] [address]  PRIMARY PROVIDER:  Ignatius Specking, MD  PRIMARY REASON FOR VISIT:  1. Ductal carcinoma in situ (DCIS) of left breast   2. Family history of prostate cancer   3. Family history of colon cancer    I connected with [name] on 11/20/2022 at 10:30 AM EDT by MyChart video conference and verified that I am speaking with the correct person using two identifiers.    Patient location: home Provider location: [facility]   HISTORY OF PRESENT ILLNESS:   [name], a 77 y.o. female, was seen for a [facility] cancer genetics consultation at the request of Dr. Ellin Saba due to a personal and family history of cancer.  Amanda Riggs presents to clinic today to discuss the possibility of a hereditary predisposition to cancer, genetic testing, and to further clarify her future cancer risks, as well as potential cancer risks for family members.   CANCER HISTORY:  In 2023, at the age of 48, Amanda Riggs was diagnosed with DCIS of the left breast, ER/PR positive. The treatment plan included lumpectomy (completed 06/2022), adjuvant radiation, adjuvant endocrine therapy.   RISK FACTORS:  Menarche was at age 58.  Ovaries intact: no.  Hysterectomy: yes.  Menopausal status: postmenopausal.  HRT use: 3 years. Colonoscopy: yes; normal.  Past Medical History:  Diagnosis Date   Anxiety    Chronic kidney disease    Depression    GERD (gastroesophageal reflux disease)    HOH (hard of hearing)    Hyperlipidemia    Hypertension    OA (osteoarthritis)    Subconjunctival hemorrhage     Past Surgical History:  Procedure Laterality Date   ABDOMINAL HYSTERECTOMY     BREAST BIOPSY  06/17/2022   MM LT RADIO FREQUENCY TAG EA ADD LESION LOC MAMMO GUIDE 06/17/2022 [facility]   BREAST LUMPECTOMY WITH RADIOFREQUENCY TAG IDENTIFICATION Left 06/18/2022   Procedure: BREAST LUMPECTOMY WITH RADIOFREQUENCY TAG IDENTIFICATION;   Surgeon: Franky Macho, MD;  Location: [facility];  Service: General;  Laterality: Left;    FAMILY HISTORY:  We obtained a detailed, 4-generation family history.  Significant diagnoses are listed below: Family History  Problem Relation Age of Onset   Diabetes Mother    Heart disease Mother    Heart disease Father    Alcohol abuse Father    Stroke Father    Prostate cancer Brother    Prostate cancer Brother    Prostate cancer Brother    Colon cancer Maternal Aunt    Colon cancer Paternal Aunt    Colon cancer Cousin    Breast cancer Neg Hx    Amanda Riggs had 1 son, he has passed. She had 3 brothers and 4 sisters. Three of her brothers have had prostate cancer, all diagnosed over age 87.   Amanda Riggs mother died at 20. A maternal aunt had colon cancer later in life.   Amanda Riggs father died at 44. A paternal aunt had colon cancer in her 83s-80s and her daughter had colon cancer in her 92s.   Amanda Riggs is unaware of previous family history of genetic testing for hereditary cancer risks. There is no reported Ashkenazi Jewish ancestry. There is no known consanguinity.    GENETIC COUNSELING ASSESSMENT: Amanda Riggs is a 77 y.o. female with a personal and family history of cancer which is somewhat suggestive of a hereditary cancer syndrome and predisposition to cancer. We, therefore, discussed and recommended the  following at today's visit.   DISCUSSION: We discussed that approximately 10% of breast cancer is hereditary. Most cases of hereditary breast cancer are associated with BRCA1/2 genes, which also increase risk for prostate cancer, although there are other genes associated with hereditary cancer as well. Cancers and risks are gene specific. We discussed that testing is beneficial for several reasons including knowing about cancer risks, identifying potential screening and risk-reduction options that may be appropriate, and to understand if other family members could be at risk for  cancer and allow them to undergo genetic testing.   We reviewed the characteristics, features and inheritance patterns of hereditary cancer syndromes. We also discussed genetic testing, including the appropriate family members to test, the process of testing, insurance coverage and turn-around-time for results. We discussed the implications of a negative, positive and/or variant of uncertain significant result. We recommended Amanda Riggs pursue genetic testing for the [facility] Common Hereditary Cancers+RNA gene panel.   Based on Amanda Riggs personal and family history of cancer, she meets medical criteria for genetic testing. Despite that she meets criteria, she may still have an out of pocket cost.   PLAN: After considering the risks, benefits, and limitations, Amanda Riggs provided informed consent to pursue genetic testing and the blood sample was sent to [facility] for analysis of the Common Hereditary Cancers+RNA panel. Results should be available within approximately 2-3 weeks' time, at which point they will be disclosed by telephone to Ms. Macartney, as will any additional recommendations warranted by these results. Amanda Riggs will receive a summary of her genetic counseling visit and a copy of her results once available. This information will also be available in Epic.   [name] questions were answered to her satisfaction today. Our contact information was provided should additional questions or concerns arise. Thank you for the referral and allowing Amanda Riggs to share in the care of your patient.   Lacy Duverney, MS, [facility] Genetic Counselor [email or web address] Phone: [phone]  The patient was seen for a total of 20 minutes in virtual genetic counseling.  Dr. Orlie Dakin was available for discussion regarding this case.   _______________________________________________________________________ For Office Staff:  Number of people involved in session: 1 Was an Intern/  student involved with case: no

## 2022-11-24 ENCOUNTER — Other Ambulatory Visit (HOSPITAL_COMMUNITY): Payer: Medicare Other

## 2022-11-24 ENCOUNTER — Inpatient Hospital Stay: Payer: Medicare Other

## 2022-12-02 ENCOUNTER — Other Ambulatory Visit (HOSPITAL_COMMUNITY): Payer: Self-pay | Admitting: Hematology

## 2022-12-02 ENCOUNTER — Telehealth: Payer: Self-pay | Admitting: Licensed Clinical Social Worker

## 2022-12-02 ENCOUNTER — Inpatient Hospital Stay (HOSPITAL_BASED_OUTPATIENT_CLINIC_OR_DEPARTMENT_OTHER): Payer: Medicare Other | Admitting: Hematology

## 2022-12-02 VITALS — BP 102/83 | HR 89 | Temp 98.4°F | Resp 18 | Ht 60.0 in | Wt 116.8 lb

## 2022-12-02 DIAGNOSIS — Z923 Personal history of irradiation: Secondary | ICD-10-CM | POA: Diagnosis not present

## 2022-12-02 DIAGNOSIS — Z8042 Family history of malignant neoplasm of prostate: Secondary | ICD-10-CM | POA: Diagnosis not present

## 2022-12-02 DIAGNOSIS — Z1379 Encounter for other screening for genetic and chromosomal anomalies: Secondary | ICD-10-CM | POA: Insufficient documentation

## 2022-12-02 DIAGNOSIS — Z800 Family history of malignant neoplasm of digestive organs: Secondary | ICD-10-CM | POA: Diagnosis not present

## 2022-12-02 DIAGNOSIS — E559 Vitamin D deficiency, unspecified: Secondary | ICD-10-CM | POA: Insufficient documentation

## 2022-12-02 DIAGNOSIS — Z853 Personal history of malignant neoplasm of breast: Secondary | ICD-10-CM

## 2022-12-02 DIAGNOSIS — Z90710 Acquired absence of both cervix and uterus: Secondary | ICD-10-CM | POA: Diagnosis not present

## 2022-12-02 DIAGNOSIS — D0512 Intraductal carcinoma in situ of left breast: Secondary | ICD-10-CM | POA: Diagnosis not present

## 2022-12-02 DIAGNOSIS — Z79811 Long term (current) use of aromatase inhibitors: Secondary | ICD-10-CM | POA: Diagnosis not present

## 2022-12-02 MED ORDER — ERGOCALCIFEROL 1.25 MG (50000 UT) PO CAPS: 50000.0000 [IU] | ORAL_CAPSULE | ORAL | 0 refills | Status: AC

## 2022-12-02 NOTE — Progress Notes (Signed)
[facility] [address], [place] [address]    Clinic Day:  12/02/2022  Referring physician: Ignatius Specking, MD  Patient Care Team: Ignatius Specking, MD as PCP - General (Internal Medicine) Doreatha Massed, MD as Medical Oncologist (Medical Oncology) Therese Sarah, RN as Oncology Nurse Navigator (Medical Oncology)   ASSESSMENT & PLAN:   Assessment: 1.  Left breast DCIS, intermediate grade, ER/PR positive: - Diagnostic mammogram (04/09/2022): Persistent subtle distortion seen in the slightly outer left breast. - Left breast medial biopsy (04/22/2022): Focal DCIS, intermediate grade, ER 95%, PR 70%.  Left breast upper outer biopsy: Complex sclerosing lesion with UDH. - Left breast lumpectomy (06/18/2022): Complex sclerosing lesion with UDH.  Fibrocystic change and small incidental intraductal papilloma. - Left breast partial mastectomy LIQ (08/20/2022): Complex sclerosing lesions with UDH, fibrocystic changes, intraductal papillomas.  No residual DCIS or invasive malignancy. - She had no previous biopsies.  She was on HRT for 3 years. - Completed XRT end of April 2024. - Prescribed anastrozole on 09/03/2022, has not started taking yet as of 12/02/2022.   2.  Social/family history: - Lives with husband at home.  Independent of ADLs and ADLs.  Retired Midwife at [employer].  Non-smoker. - 3 of her brothers had prostate cancer.  1 maternal aunt had colon cancer and 1 paternal aunt had colon cancer.    Plan: 1.  Left breast DCIS, intermediate grade, ER/PR positive: - She has completed radiation therapy end of April 2024. - She has not started taking anastrozole yet.  We have prescribed it on 09/03/2022. - I have discussed side effects of anastrozole in detail including vasomotor symptoms, musculoskeletal symptoms and to decrease the bone mineral density among others. - She will start taking it starting tomorrow as she has the pills. -  Recommend bilateral breast diagnostic mammogram after April 10, 2023. - RTC 6 months for follow-up with repeat labs and exam.   2.  Bone health: - Vitamin D level is 13.4. - Will start her on vitamin D 50,000 units weekly for 12 weeks.  After 12 weeks, she will start taking 2000 units vitamin D daily. - Will repeat vitamin D levels in 6 months. - Will also schedule bone density test.    Orders Placed This Encounter  Procedures   MM 3D DIAGNOSTIC MAMMOGRAM BILATERAL BREAST    Standing Status:   Future    Standing Expiration Date:   12/02/2023    Order Specific Question:   Reason for Exam (SYMPTOM  OR DIAGNOSIS REQUIRED)    Answer:   personal hx of breast cancer    Order Specific Question:   Preferred imaging location?    Answer:   [facility]    Order Specific Question:   Release to patient    Answer:   Immediate   CBC with Differential/Platelet    Standing Status:   Future    Standing Expiration Date:   12/02/2023    Order Specific Question:   Release to patient    Answer:   Immediate   Comprehensive metabolic panel    Standing Status:   Future    Standing Expiration Date:   12/02/2023    Order Specific Question:   Release to patient    Answer:   Immediate   VITAMIN D 25 Hydroxy (Vit-D Deficiency, Fractures)    Standing Status:   Future    Standing Expiration Date:   12/02/2023    Order  Specific Question:   Release to patient    Answer:   Immediate      I,Katie Daubenspeck,acting as a scribe for Doreatha Massed, MD.,have documented all relevant documentation on the behalf of Doreatha Massed, MD,as directed by  Doreatha Massed, MD while in the presence of Doreatha Massed, MD.   I, Doreatha Massed MD, have reviewed the above documentation for accuracy and completeness, and I agree with the above.   Doreatha Massed, MD   5/21/20245:05 PM  CHIEF COMPLAINT:   Diagnosis: Left breast cancer    Cancer Staging  Ductal carcinoma in situ  (DCIS) of left breast Staging form: Breast, AJCC 8th Edition - Clinical stage from 09/03/2022: Stage 0 (cTis (DCIS), cN0, cM0, ER+, PR+, HER2: Not Assessed) - Unsigned    Prior Therapy: 1. left lumpectomy, 06/18/22 and 08/20/22 2. XRT from 10/14/22 through 11/11/22  Current Therapy:  anastrozole   HISTORY OF PRESENT ILLNESS:   Oncology History  Ductal carcinoma in situ (DCIS) of left breast  08/20/2022 Initial Diagnosis   Ductal carcinoma in situ (DCIS) of left breast   12/02/2022 Genetic Testing   Negative Common Hereditary Cancers + RNA panel from [facility]. Report date is Dec 02, 2022.  The [facility] Common Hereditary Cancers + RNA Panel includes sequencing, deletion/duplication, and RNA analysis of the following 48 genes: APC, ATM, AXIN2, BAP1, BARD1, BMPR1A, BRCA1, BRCA2, BRIP1, CDH1, CDK4*, CDKN2A*, CHEK2, CTNNA1, DICER1, EPCAM* (del/dup only), FH, GREM1* (promoter dup analysis only), HOXB13*, KIT*, MBD4*, MEN1, MLH1, MSH2, MSH3, MSH6, MUTYH, NF1, NTHL1, PALB2, PDGFRA*, PMS2, POLD1, POLE, PTEN, RAD51C, RAD51D, SDHA (sequencing only), SDHB, SDHC, SDHD, SMAD4, SMARCA4, STK11, TP53, TSC1, TSC2, VHL.  *Genes without RNA analysis.      INTERVAL HISTORY:   Brighten is a 77 y.o. female presenting to clinic today for follow up of Left breast cancer. She was last seen by me on 09/03/22.  Since her last visit, she underwent bilateral mammogram on 09/24/22 showing: new postsurgical changes in left breast with likely benign calcifications; no evidence of malignancy in either breast. Six month follow up of left breast was recommended.  She completed radiation therapy to the left breast on 11/11/22.  She also had genetic testing on 11/20/22. Results are pending.  Today, she states that she is doing well overall. Her appetite level is at 100%. Her energy level is at 75%.  PAST MEDICAL HISTORY:   Past Medical History: Past Medical History:  Diagnosis Date   Anxiety    Chronic kidney disease     Depression    GERD (gastroesophageal reflux disease)    HOH (hard of hearing)    Hyperlipidemia    Hypertension    OA (osteoarthritis)    Subconjunctival hemorrhage     Surgical History: Past Surgical History:  Procedure Laterality Date   ABDOMINAL HYSTERECTOMY     BREAST BIOPSY  06/17/2022   MM LT RADIO FREQUENCY TAG EA ADD LESION LOC MAMMO GUIDE 06/17/2022 [facility]   BREAST LUMPECTOMY WITH RADIOFREQUENCY TAG IDENTIFICATION Left 06/18/2022   Procedure: BREAST LUMPECTOMY WITH RADIOFREQUENCY TAG IDENTIFICATION;  Surgeon: Franky Macho, MD;  Location: [facility];  Service: General;  Laterality: Left;    Social History: Social History   Socioeconomic History   Marital status: Married    Spouse name: Not on file   Number of children: Not on file   Years of education: Not on file   Highest education level: Not on file  Occupational History   Not on file  Tobacco Use  Smoking status: Never    Passive exposure: Never   Smokeless tobacco: Never  Vaping Use   Vaping Use: Never used  Substance and Sexual Activity   Alcohol use: Not Currently   Drug use: Not Currently   Sexual activity: Yes    Birth control/protection: Post-menopausal  Other Topics Concern   Not on file  Social History Narrative   Not on file   Social Determinants of Health   Financial Resource Strain: Not on file  Food Insecurity: No Food Insecurity (09/03/2022)   Hunger Vital Sign    Worried About Running Out of Food in the Last Year: Never true    Ran Out of Food in the Last Year: Never true  Transportation Needs: No Transportation Needs (09/03/2022)   PRAPARE - Administrator, Civil Service (Medical): No    Lack of Transportation (Non-Medical): No  Physical Activity: Not on file  Stress: Not on file  Social Connections: Not on file  Intimate Partner Violence: Not At Risk (09/03/2022)   Humiliation, Afraid, Rape, and Kick questionnaire    Fear of Current or Ex-Partner: No    Emotionally  Abused: No    Physically Abused: No    Sexually Abused: No    Family History: Family History  Problem Relation Age of Onset   Diabetes Mother    Heart disease Mother    Heart disease Father    Alcohol abuse Father    Stroke Father    Prostate cancer Brother    Prostate cancer Brother    Prostate cancer Brother    Colon cancer Maternal Aunt    Colon cancer Paternal Aunt    Colon cancer Cousin    Breast cancer Neg Hx     Current Medications:  Current Outpatient Medications:    anastrozole (ARIMIDEX) 1 MG tablet, Take 1 tablet (1 mg total) by mouth daily., Disp: 90 tablet, Rfl: 1   aspirin EC 81 MG tablet, Take 81 mg by mouth every other day. Swallow whole., Disp: , Rfl:    busPIRone (BUSPAR) 5 MG tablet, Take 5 mg by mouth daily as needed (anxiety)., Disp: , Rfl:    cyanocobalamin (VITAMIN B12) 1000 MCG tablet, Take 1,000 mcg by mouth daily., Disp: , Rfl:    ergocalciferol (VITAMIN D2) 1.25 MG (50000 UT) capsule, Take 1 capsule (50,000 Units total) by mouth once a week., Disp: 12 capsule, Rfl: 0   Multiple Vitamin (MULTIVITAMIN WITH MINERALS) TABS tablet, Take 1 tablet by mouth daily., Disp: , Rfl:    pravastatin (PRAVACHOL) 40 MG tablet, Take 40 mg by mouth once a week., Disp: , Rfl:    traMADol (ULTRAM) 50 MG tablet, Take 1 tablet (50 mg total) by mouth every 6 (six) hours as needed., Disp: 15 tablet, Rfl: 0   triamterene-hydrochlorothiazide (MAXZIDE-25) 37.5-25 MG tablet, Take 1 tablet by mouth daily., Disp: , Rfl:    Allergies: No Known Allergies  REVIEW OF SYSTEMS:   Review of Systems  Constitutional:  Negative for chills, fatigue and fever.  HENT:   Negative for lump/mass, mouth sores, nosebleeds, sore throat and trouble swallowing.   Eyes:  Negative for eye problems.  Respiratory:  Negative for cough and shortness of breath.   Cardiovascular:  Positive for chest pain. Negative for leg swelling and palpitations.  Gastrointestinal:  Positive for constipation. Negative  for abdominal pain, diarrhea, nausea and vomiting.  Genitourinary:  Negative for bladder incontinence, difficulty urinating, dysuria, frequency, hematuria and nocturia.   Musculoskeletal:  Negative  for arthralgias, back pain, flank pain, myalgias and neck pain.  Skin:  Negative for itching and rash.  Neurological:  Negative for dizziness, headaches and numbness.  Hematological:  Does not bruise/bleed easily.  Psychiatric/Behavioral:  Negative for depression, sleep disturbance and suicidal ideas. The patient is nervous/anxious.   All other systems reviewed and are negative.    VITALS:   Blood pressure 102/83, pulse 89, temperature 98.4 F (36.9 C), temperature source Oral, resp. rate 18, height 5' (1.524 m), weight 116 lb 12.8 oz (53 kg), SpO2 100 %.  Wt Readings from Last 3 Encounters:  12/02/22 116 lb 12.8 oz (53 kg)  09/23/22 118 lb (53.5 kg)  09/18/22 118 lb (53.5 kg)    Body mass index is 22.81 kg/m.  Performance status (ECOG): 1 - Symptomatic but completely ambulatory  PHYSICAL EXAM:   Physical Exam Vitals and nursing note reviewed. Exam conducted with a chaperone present.  Constitutional:      Appearance: Normal appearance.  Cardiovascular:     Rate and Rhythm: Normal rate and regular rhythm.     Pulses: Normal pulses.     Heart sounds: Normal heart sounds.  Pulmonary:     Effort: Pulmonary effort is normal.     Breath sounds: Normal breath sounds.  Abdominal:     Palpations: Abdomen is soft. There is no hepatomegaly, splenomegaly or mass.     Tenderness: There is no abdominal tenderness.  Musculoskeletal:     Right lower leg: No edema.     Left lower leg: No edema.  Lymphadenopathy:     Cervical: No cervical adenopathy.     Right cervical: No superficial, deep or posterior cervical adenopathy.    Left cervical: No superficial, deep or posterior cervical adenopathy.     Upper Body:     Right upper body: No supraclavicular or axillary adenopathy.     Left upper  body: No supraclavicular or axillary adenopathy.  Neurological:     General: No focal deficit present.     Mental Status: She is alert and oriented to person, place, and time.  Psychiatric:        Mood and Affect: Mood normal.        Behavior: Behavior normal.     LABS:      Latest Ref Rng & Units 11/20/2022   10:54 AM 06/16/2022   11:17 AM  CBC  WBC 4.0 - 10.5 K/uL 4.7  5.9   Hemoglobin 12.0 - 15.0 g/dL 16.1  09.6   Hematocrit 36.0 - 46.0 % 41.9  37.8   Platelets 150 - 400 K/uL 169  189       Latest Ref Rng & Units 11/20/2022   10:54 AM 06/16/2022   11:17 AM  CMP  Glucose 70 - 99 mg/dL 97  89   BUN 8 - 23 mg/dL 14  17   Creatinine 0.45 - 1.00 mg/dL 4.09  8.11   Sodium 914 - 145 mmol/L 137  137   Potassium 3.5 - 5.1 mmol/L 4.0  3.8   Chloride 98 - 111 mmol/L 100  105   CO2 22 - 32 mmol/L 28  25   Calcium 8.9 - 10.3 mg/dL 9.5  9.2   Total Protein 6.5 - 8.1 g/dL 7.6  7.0   Total Bilirubin 0.3 - 1.2 mg/dL 1.0  0.8   Alkaline Phos 38 - 126 U/L 68  52   AST 15 - 41 U/L 20  20   ALT 0 - 44 U/L  17  16      No results found for: "CEA1", "CEA" / No results found for: "CEA1", "CEA" No results found for: "PSA1" No results found for: "ZOX096" No results found for: "CAN125"  No results found for: "TOTALPROTELP", "ALBUMINELP", "A1GS", "A2GS", "BETS", "BETA2SER", "GAMS", "MSPIKE", "SPEI" No results found for: "TIBC", "FERRITIN", "IRONPCTSAT" No results found for: "LDH"   STUDIES:   No results found.

## 2022-12-02 NOTE — Patient Instructions (Signed)
[facility]  Discharge Instructions  You were seen and examined today by Dr. Ellin Saba.  Dr. Ellin Saba discussed your most recent lab work which revealed that your vitamin D is really low.  Dr. Ellin Saba is sending Vitamin D to the pharmacy to be taken once weekly. When you finish that prescription start taking 2000 IU once daily.  Start taking the Anastrozole once daily.  Follow-up as scheduled.    Thank you for choosing [facility] to provide your oncology and hematology care.   To afford each patient quality time with our provider, please arrive at least 15 minutes before your scheduled appointment time. You may need to reschedule your appointment if you arrive late (10 or more minutes). Arriving late affects you and other patients whose appointments are after yours.  Also, if you miss three or more appointments without notifying the office, you may be dismissed from the clinic at the provider's discretion.    Again, thank you for choosing [facility].  Our hope is that these requests will decrease the amount of time that you wait before being seen by our physicians.   If you have a lab appointment with the Cancer Center - please note that after April 8th, all labs will be drawn in the cancer center.  You do not have to check in or register with the main entrance as you have in the past but will complete your check-in at the cancer center.            _____________________________________________________________  Should you have questions after your visit to [facility], please contact our office at [phone] and follow the prompts.  Our office hours are 8:00 a.m. to 4:30 p.m. Monday - Thursday and 8:00 a.m. to 2:30 p.m. Friday.  Please note that voicemails left after 4:00 p.m. may not be returned until the following business day.  We are closed weekends and all major holidays.  You do have access to a  nurse 24-7, just call the main number to the clinic [phone] and do not press any options, hold on the line and a nurse will answer the phone.    For prescription refill requests, have your pharmacy contact our office and allow 72 hours.    Masks are no longer required in the cancer centers. If you would like for your care team to wear a mask while they are taking care of you, please let them know. You may have one support person who is at least 75 years old accompany you for your appointments.

## 2022-12-02 NOTE — Telephone Encounter (Signed)
Contacted the patient with attempt to discuss genetic testing results, however, there was no answer and the mailbox was full.

## 2022-12-03 ENCOUNTER — Telehealth: Payer: Self-pay | Admitting: Licensed Clinical Social Worker

## 2022-12-10 ENCOUNTER — Ambulatory Visit: Payer: Self-pay | Admitting: Licensed Clinical Social Worker

## 2022-12-10 NOTE — Telephone Encounter (Signed)
I contacted Amanda Riggs to discuss her genetic testing results. I was unable to speak with her or leave a voicemail on multiple occasions. No pathogenic variants were identified in the 48 genes analyzed. Detailed clinic note to follow.   The test report has been scanned into EPIC and is located under the Molecular Pathology section of the Results Review tab.  A portion of the result report is included below for reference.      Amanda Duverney, MS, [facility] Genetic [email or web address] [email or web address] Phone: [phone]

## 2022-12-10 NOTE — Progress Notes (Signed)
HPI:   Ms. Parkhurst was previously seen in the [facility] due to a personal and family history of cancer and concerns regarding a hereditary predisposition to cancer. Please refer to our prior cancer genetics clinic note for more information regarding our discussion, assessment and recommendations, at the time. Ms. Powelson recent genetic test results were disclosed to her, as were recommendations warranted by these results. These results and recommendations are discussed in more detail below.  CANCER HISTORY:  Oncology History  Ductal carcinoma in situ (DCIS) of left breast  08/20/2022 Initial Diagnosis   Ductal carcinoma in situ (DCIS) of left breast   12/02/2022 Genetic Testing   Negative Common Hereditary Cancers + RNA panel from [facility]. Report date is Dec 02, 2022.  The [facility] Common Hereditary Cancers + RNA Panel includes sequencing, deletion/duplication, and RNA analysis of the following 48 genes: APC, ATM, AXIN2, BAP1, BARD1, BMPR1A, BRCA1, BRCA2, BRIP1, CDH1, CDK4*, CDKN2A*, CHEK2, CTNNA1, DICER1, EPCAM* (del/dup only), FH, GREM1* (promoter dup analysis only), HOXB13*, KIT*, MBD4*, MEN1, MLH1, MSH2, MSH3, MSH6, MUTYH, NF1, NTHL1, PALB2, PDGFRA*, PMS2, POLD1, POLE, PTEN, RAD51C, RAD51D, SDHA (sequencing only), SDHB, SDHC, SDHD, SMAD4, SMARCA4, STK11, TP53, TSC1, TSC2, VHL.  *Genes without RNA analysis.     FAMILY HISTORY:  We obtained a detailed, 4-generation family history.  Significant diagnoses are listed below: Family History  Problem Relation Age of Onset   Diabetes Mother    Heart disease Mother    Heart disease Father    Alcohol abuse Father    Stroke Father    Prostate cancer Brother    Prostate cancer Brother    Prostate cancer Brother    Colon cancer Maternal Aunt    Colon cancer Paternal Aunt    Colon cancer Cousin    Breast cancer Neg Hx    Ms. Bembenek had 1 son, he has passed. She had 3 brothers and 4 sisters. Three of her brothers have had  prostate cancer, all diagnosed over age 25.    Ms. Stirn mother died at 68. A maternal aunt had colon cancer later in life.    Ms. Huckins father died at 68. A paternal aunt had colon cancer in her 36s-80s and her daughter had colon cancer in her 38s.    Ms. Huenink is unaware of previous family history of genetic testing for hereditary cancer risks. There is no reported Ashkenazi Jewish ancestry. There is no known consanguinity.      GENETIC TEST RESULTS:  The [facility] Common Hereditary Cancers+RNA Panel found no pathogenic mutations.   The Common Hereditary Cancers Panel + RNA offered by [facility] includes sequencing and/or deletion duplication testing of the following 48 genes: APC*, ATM*, AXIN2, BAP1, BARD1, BMPR1A, BRCA1, BRCA2, BRIP1, CDH1, CDK4, CDKN2A (p14ARF), CDKN2A (p16INK4a), CHEK2, CTNNA1, DICER1*, EPCAM*, FH*, GREM1*, HOXB13, KIT, MBD4, MEN1*, MLH1*, MSH2*, MSH3*, MSH6*, MUTYH, NF1*, NTHL1, PALB2, PDGFRA, PMS2*, POLD1*, POLE, PTEN*, RAD51C, RAD51D, SDHA*, SDHB, SDHC*, SDHD, SMAD4, SMARCA4, STK11, TP53, TSC1*, TSC2, VHL.   The test report has been scanned into EPIC and is located under the Molecular Pathology section of the Results Review tab.  A portion of the result report is included below for reference. Genetic testing reported out on 12/02/2022.      Even though a pathogenic variant was not identified, possible explanations for the cancer in the family may include: There may be no hereditary risk for cancer in the family. The cancers in Ms. Petron and/or her family may be sporadic/familial or due  to other genetic and environmental factors. There may be a gene mutation in one of these genes that current testing methods cannot detect but that chance is small. There could be another gene that has not yet been discovered, or that we have not yet tested, that is responsible for the cancer diagnoses in the family.  It is also possible there is a hereditary cause for the cancer  in the family that Ms. Menzies did not inherit.  Therefore, it is important to remain in touch with cancer genetics in the future so that we can continue to offer Ms. Novakowski the most up to date genetic testing.   ADDITIONAL GENETIC TESTING:  We discussed with Ms. Bauernfeind that her genetic testing was fairly extensive.  If there are additional relevant genes identified to increase cancer risk that can be analyzed in the future, we would be happy to discuss and coordinate this testing at that time.    CANCER SCREENING RECOMMENDATIONS:  Ms. Diane test result is considered negative (normal).  This means that we have not identified a hereditary cause for her personal and family history of cancer at this time.   An individual's cancer risk and medical management are not determined by genetic test results alone. Overall cancer risk assessment incorporates additional factors, including personal medical history, family history, and any available genetic information that may result in a personalized plan for cancer prevention and surveillance. Therefore, it is recommended she continue to follow the cancer management and screening guidelines provided by her oncology and primary healthcare provider.  RECOMMENDATIONS FOR FAMILY MEMBERS:   Since she did not inherit a identifiable mutation in a cancer predisposition gene included on this panel, her children could not have inherited a known mutation from her in one of these genes. Individuals in this family might be at some increased risk of developing cancer, over the general population risk, due to the family history of cancer.  Individuals in the family should notify their providers of the family history of cancer. We recommend women in this family have a yearly mammogram beginning at age 1, or 54 years younger than the earliest onset of cancer, an annual clinical breast exam, and perform monthly breast self-exams.  Family members should have colonoscopies by  at age 74, or earlier, as recommended by their providers. Other members of the family may still carry a pathogenic variant in one of these genes that Ms. Stringfellow did not inherit. Based on the family history, we recommend her brothers who have had prostate cancer to have genetic counseling and testing. Ms. Zhou will let us know if we can be of any assistance in coordinating genetic counseling and/or testing for this family member.    FOLLOW-UP:  Lastly, we discussed with Ms. Mathey that cancer genetics is a rapidly advancing field and it is possible that new genetic tests will be appropriate for her and/or her family members in the future. We encouraged her to remain in contact with cancer genetics on an annual basis so we can update her personal and family histories and let her know of advances in cancer genetics that may benefit this family.   Our contact number was provided. Ms. Rosenbalm questions were answered to her satisfaction, and she knows she is welcome to call us at anytime with additional questions or concerns.    Lacy Duverney, MS, [facility] Genetic Counselor [email or web address] Phone: [phone]

## 2022-12-18 DIAGNOSIS — D0512 Intraductal carcinoma in situ of left breast: Secondary | ICD-10-CM | POA: Diagnosis not present

## 2022-12-18 DIAGNOSIS — Z9221 Personal history of antineoplastic chemotherapy: Secondary | ICD-10-CM | POA: Diagnosis not present

## 2022-12-25 ENCOUNTER — Telehealth: Payer: Medicare Other | Admitting: Licensed Clinical Social Worker

## 2023-01-21 DIAGNOSIS — Z1339 Encounter for screening examination for other mental health and behavioral disorders: Secondary | ICD-10-CM | POA: Diagnosis not present

## 2023-01-21 DIAGNOSIS — Z0000 Encounter for general adult medical examination without abnormal findings: Secondary | ICD-10-CM | POA: Diagnosis not present

## 2023-01-21 DIAGNOSIS — Z1331 Encounter for screening for depression: Secondary | ICD-10-CM | POA: Diagnosis not present

## 2023-01-21 DIAGNOSIS — Z87891 Personal history of nicotine dependence: Secondary | ICD-10-CM | POA: Diagnosis not present

## 2023-01-21 DIAGNOSIS — E7800 Pure hypercholesterolemia, unspecified: Secondary | ICD-10-CM | POA: Diagnosis not present

## 2023-01-21 DIAGNOSIS — I10 Essential (primary) hypertension: Secondary | ICD-10-CM | POA: Diagnosis not present

## 2023-01-21 DIAGNOSIS — Z299 Encounter for prophylactic measures, unspecified: Secondary | ICD-10-CM | POA: Diagnosis not present

## 2023-01-21 DIAGNOSIS — R5383 Other fatigue: Secondary | ICD-10-CM | POA: Diagnosis not present

## 2023-01-21 DIAGNOSIS — Z7189 Other specified counseling: Secondary | ICD-10-CM | POA: Diagnosis not present

## 2023-01-21 DIAGNOSIS — Z79899 Other long term (current) drug therapy: Secondary | ICD-10-CM | POA: Diagnosis not present

## 2023-02-17 DIAGNOSIS — J3489 Other specified disorders of nose and nasal sinuses: Secondary | ICD-10-CM | POA: Diagnosis not present

## 2023-02-17 DIAGNOSIS — Z299 Encounter for prophylactic measures, unspecified: Secondary | ICD-10-CM | POA: Diagnosis not present

## 2023-02-17 DIAGNOSIS — R059 Cough, unspecified: Secondary | ICD-10-CM | POA: Diagnosis not present

## 2023-02-17 DIAGNOSIS — I10 Essential (primary) hypertension: Secondary | ICD-10-CM | POA: Diagnosis not present

## 2023-03-10 DIAGNOSIS — J40 Bronchitis, not specified as acute or chronic: Secondary | ICD-10-CM | POA: Diagnosis not present

## 2023-03-10 DIAGNOSIS — I10 Essential (primary) hypertension: Secondary | ICD-10-CM | POA: Diagnosis not present

## 2023-03-10 DIAGNOSIS — R52 Pain, unspecified: Secondary | ICD-10-CM | POA: Diagnosis not present

## 2023-03-10 DIAGNOSIS — R011 Cardiac murmur, unspecified: Secondary | ICD-10-CM | POA: Diagnosis not present

## 2023-03-10 DIAGNOSIS — Z299 Encounter for prophylactic measures, unspecified: Secondary | ICD-10-CM | POA: Diagnosis not present

## 2023-03-10 DIAGNOSIS — K219 Gastro-esophageal reflux disease without esophagitis: Secondary | ICD-10-CM | POA: Diagnosis not present

## 2023-03-23 DIAGNOSIS — R011 Cardiac murmur, unspecified: Secondary | ICD-10-CM | POA: Diagnosis not present

## 2023-04-08 DIAGNOSIS — D0512 Intraductal carcinoma in situ of left breast: Secondary | ICD-10-CM | POA: Diagnosis not present

## 2023-04-08 DIAGNOSIS — R92333 Mammographic heterogeneous density, bilateral breasts: Secondary | ICD-10-CM | POA: Diagnosis not present

## 2023-04-08 DIAGNOSIS — R921 Mammographic calcification found on diagnostic imaging of breast: Secondary | ICD-10-CM | POA: Diagnosis not present

## 2023-04-14 ENCOUNTER — Ambulatory Visit (HOSPITAL_COMMUNITY): Payer: Medicare Other

## 2023-04-14 ENCOUNTER — Inpatient Hospital Stay (HOSPITAL_COMMUNITY): Admission: RE | Admit: 2023-04-14 | Payer: Medicare Other | Source: Ambulatory Visit

## 2023-04-14 DIAGNOSIS — M79605 Pain in left leg: Secondary | ICD-10-CM | POA: Diagnosis not present

## 2023-04-14 DIAGNOSIS — I10 Essential (primary) hypertension: Secondary | ICD-10-CM | POA: Diagnosis not present

## 2023-04-14 DIAGNOSIS — R059 Cough, unspecified: Secondary | ICD-10-CM | POA: Diagnosis not present

## 2023-04-14 DIAGNOSIS — Z299 Encounter for prophylactic measures, unspecified: Secondary | ICD-10-CM | POA: Diagnosis not present

## 2023-05-27 ENCOUNTER — Inpatient Hospital Stay: Payer: Medicare Other

## 2023-06-02 ENCOUNTER — Other Ambulatory Visit: Payer: Self-pay | Admitting: *Deleted

## 2023-06-02 DIAGNOSIS — E559 Vitamin D deficiency, unspecified: Secondary | ICD-10-CM

## 2023-06-02 DIAGNOSIS — D0512 Intraductal carcinoma in situ of left breast: Secondary | ICD-10-CM

## 2023-06-03 ENCOUNTER — Inpatient Hospital Stay: Payer: Medicare Other | Admitting: Hematology

## 2023-06-18 ENCOUNTER — Inpatient Hospital Stay: Payer: Medicare Other | Admitting: Hematology

## 2023-07-21 DIAGNOSIS — E7800 Pure hypercholesterolemia, unspecified: Secondary | ICD-10-CM | POA: Diagnosis not present

## 2023-07-21 DIAGNOSIS — C801 Malignant (primary) neoplasm, unspecified: Secondary | ICD-10-CM | POA: Diagnosis not present

## 2023-07-21 DIAGNOSIS — I10 Essential (primary) hypertension: Secondary | ICD-10-CM | POA: Diagnosis not present

## 2023-07-21 DIAGNOSIS — Z299 Encounter for prophylactic measures, unspecified: Secondary | ICD-10-CM | POA: Diagnosis not present

## 2023-07-21 DIAGNOSIS — M159 Polyosteoarthritis, unspecified: Secondary | ICD-10-CM | POA: Diagnosis not present

## 2023-07-21 DIAGNOSIS — D692 Other nonthrombocytopenic purpura: Secondary | ICD-10-CM | POA: Diagnosis not present

## 2023-09-29 DIAGNOSIS — H04123 Dry eye syndrome of bilateral lacrimal glands: Secondary | ICD-10-CM | POA: Diagnosis not present

## 2023-10-12 DIAGNOSIS — D0512 Intraductal carcinoma in situ of left breast: Secondary | ICD-10-CM | POA: Diagnosis not present

## 2023-10-12 DIAGNOSIS — Z08 Encounter for follow-up examination after completed treatment for malignant neoplasm: Secondary | ICD-10-CM | POA: Diagnosis not present

## 2023-10-12 DIAGNOSIS — Z853 Personal history of malignant neoplasm of breast: Secondary | ICD-10-CM | POA: Diagnosis not present

## 2023-10-12 LAB — HM MAMMOGRAPHY

## 2023-10-20 DIAGNOSIS — C801 Malignant (primary) neoplasm, unspecified: Secondary | ICD-10-CM | POA: Diagnosis not present

## 2023-10-20 DIAGNOSIS — Z299 Encounter for prophylactic measures, unspecified: Secondary | ICD-10-CM | POA: Diagnosis not present

## 2023-10-20 DIAGNOSIS — D692 Other nonthrombocytopenic purpura: Secondary | ICD-10-CM | POA: Diagnosis not present

## 2023-10-20 DIAGNOSIS — I10 Essential (primary) hypertension: Secondary | ICD-10-CM | POA: Diagnosis not present

## 2023-10-21 DIAGNOSIS — D0512 Intraductal carcinoma in situ of left breast: Secondary | ICD-10-CM | POA: Diagnosis not present

## 2023-10-27 ENCOUNTER — Inpatient Hospital Stay (HOSPITAL_BASED_OUTPATIENT_CLINIC_OR_DEPARTMENT_OTHER): Admitting: Hematology

## 2023-10-27 ENCOUNTER — Inpatient Hospital Stay: Attending: Hematology

## 2023-10-27 VITALS — BP 146/75 | HR 71 | Temp 97.9°F | Resp 18 | Ht 60.0 in | Wt 124.0 lb

## 2023-10-27 DIAGNOSIS — Z8042 Family history of malignant neoplasm of prostate: Secondary | ICD-10-CM | POA: Insufficient documentation

## 2023-10-27 DIAGNOSIS — E559 Vitamin D deficiency, unspecified: Secondary | ICD-10-CM | POA: Insufficient documentation

## 2023-10-27 DIAGNOSIS — Z79811 Long term (current) use of aromatase inhibitors: Secondary | ICD-10-CM | POA: Insufficient documentation

## 2023-10-27 DIAGNOSIS — Z800 Family history of malignant neoplasm of digestive organs: Secondary | ICD-10-CM | POA: Diagnosis not present

## 2023-10-27 DIAGNOSIS — Z923 Personal history of irradiation: Secondary | ICD-10-CM | POA: Diagnosis not present

## 2023-10-27 DIAGNOSIS — Z90710 Acquired absence of both cervix and uterus: Secondary | ICD-10-CM | POA: Insufficient documentation

## 2023-10-27 DIAGNOSIS — M1990 Unspecified osteoarthritis, unspecified site: Secondary | ICD-10-CM | POA: Diagnosis not present

## 2023-10-27 DIAGNOSIS — D0512 Intraductal carcinoma in situ of left breast: Secondary | ICD-10-CM

## 2023-10-27 LAB — COMPREHENSIVE METABOLIC PANEL WITH GFR
ALT: 14 U/L (ref 0–44)
AST: 19 U/L (ref 15–41)
Albumin: 4.1 g/dL (ref 3.5–5.0)
Alkaline Phosphatase: 59 U/L (ref 38–126)
Anion gap: 9 (ref 5–15)
BUN: 18 mg/dL (ref 8–23)
CO2: 27 mmol/L (ref 22–32)
Calcium: 9.5 mg/dL (ref 8.9–10.3)
Chloride: 102 mmol/L (ref 98–111)
Creatinine, Ser: 0.85 mg/dL (ref 0.44–1.00)
GFR, Estimated: >60 mL/min (ref >60)
Glucose, Bld: 102 mg/dL — ABNORMAL HIGH (ref 70–99)
Potassium: 3.9 mmol/L (ref 3.5–5.1)
Sodium: 138 mmol/L (ref 135–145)
Total Bilirubin: 1 mg/dL (ref 0.0–1.2)
Total Protein: 7.2 g/dL (ref 6.5–8.1)

## 2023-10-27 LAB — CBC WITH DIFFERENTIAL/PLATELET
Abs Immature Granulocytes: 0.01 10*3/uL (ref 0.00–0.07)
Basophils Absolute: 0.1 10*3/uL (ref 0.0–0.1)
Basophils Relative: 1 %
Eosinophils Absolute: 0.4 10*3/uL (ref 0.0–0.5)
Eosinophils Relative: 8 %
HCT: 40.4 % (ref 36.0–46.0)
Hemoglobin: 12.7 g/dL (ref 12.0–15.0)
Immature Granulocytes: 0 %
Lymphocytes Relative: 29 %
Lymphs Abs: 1.5 10*3/uL (ref 0.7–4.0)
MCH: 28.9 pg (ref 26.0–34.0)
MCHC: 31.4 g/dL (ref 30.0–36.0)
MCV: 92 fL (ref 80.0–100.0)
Monocytes Absolute: 0.4 10*3/uL (ref 0.1–1.0)
Monocytes Relative: 7 %
Neutro Abs: 2.8 10*3/uL (ref 1.7–7.7)
Neutrophils Relative %: 55 %
Platelets: 188 10*3/uL (ref 150–400)
RBC: 4.39 MIL/uL (ref 3.87–5.11)
RDW: 13.6 % (ref 11.5–15.5)
WBC: 5.2 10*3/uL (ref 4.0–10.5)
nRBC: 0 % (ref 0.0–0.2)

## 2023-10-27 LAB — VITAMIN D 25 HYDROXY (VIT D DEFICIENCY, FRACTURES): Vit D, 25-Hydroxy: 28.76 ng/mL — ABNORMAL LOW (ref 30–100)

## 2023-10-27 NOTE — Progress Notes (Signed)
 [facility] [address] [place] [address]    Clinic Day:  10/27/2023  Referring physician: Ignatius Specking, MD  Patient Care Team: Ignatius Specking, MD as PCP - General (Internal Medicine) Doreatha Massed, MD as Medical Oncologist (Medical Oncology) Therese Sarah, RN as Oncology Nurse Navigator (Medical Oncology)   ASSESSMENT & PLAN:   Assessment: 1.  Left breast DCIS, intermediate grade, ER/PR positive: - Diagnostic mammogram (04/09/2022): Persistent subtle distortion seen in the slightly outer left breast. - Left breast medial biopsy (04/22/2022): Focal DCIS, intermediate grade, ER 95%, PR 70%.  Left breast upper outer biopsy: Complex sclerosing lesion with UDH. - Left breast lumpectomy (06/18/2022): Complex sclerosing lesion with UDH.  Fibrocystic change and small incidental intraductal papilloma. - Left breast partial mastectomy LIQ (08/20/2022): Complex sclerosing lesions with UDH, fibrocystic changes, intraductal papillomas.  No residual DCIS or invasive malignancy. - She had no previous biopsies.  She was on HRT for 3 years. - Completed XRT end of April 2024. - Prescribed anastrozole on 09/03/2022, has not started taking yet as of 12/02/2022.  Refused to start anastrozole after taking 1 to 2 pills due to potential side effects.   2.  Social/family history: - Lives with husband at home.  Independent of ADLs and ADLs.  Retired Midwife at [employer].  Non-smoker. - 3 of her brothers had prostate cancer.  1 maternal aunt had colon cancer and 1 paternal aunt had colon cancer.    Plan: 1.  Left breast DCIS, intermediate grade, ER/PR positive: - She did not start taking anastrozole due to potential side effects.  She does not want to try it. - Physical exam: Did not reveal any palpable masses or adenopathy.  Left breast nipple is inverted since surgery. - Labs today: Normal LFTs and creatinine.  CBC grossly normal. - Reviewed  mammogram from 10/06/2023: No abnormalities detected. - RTC 1 year for follow-up with repeat labs and exam.  She will have mammogram done at [facility].   2.  Bone health: - She is taking vitamin D 2000 units daily.  Vitamin D level is pending.    Orders Placed This Encounter  Procedures   CBC with Differential    Standing Status:   Future    Expected Date:   10/24/2024    Expiration Date:   10/26/2024   Comprehensive metabolic panel    Standing Status:   Future    Expected Date:   10/24/2024    Expiration Date:   10/26/2024   VITAMIN D 25 Hydroxy (Vit-D Deficiency, Fractures)    Standing Status:   Future    Expected Date:   10/24/2024    Expiration Date:   10/26/2024      Mikeal Hawthorne R Teague,acting as a scribe for Doreatha Massed, MD.,have documented all relevant documentation on the behalf of Doreatha Massed, MD,as directed by  Doreatha Massed, MD while in the presence of Doreatha Massed, MD.  I, Doreatha Massed MD, have reviewed the above documentation for accuracy and completeness, and I agree with the above.    Doreatha Massed, MD   4/15/202510:28 AM  CHIEF COMPLAINT:   Diagnosis: Left breast cancer    Cancer Staging  Ductal carcinoma in situ (DCIS) of left breast Staging form: Breast, AJCC 8th Edition - Clinical stage from 09/03/2022: Stage 0 (cTis (DCIS), cN0, cM0, ER+, PR+, HER2: Not Assessed) - Unsigned    Prior Therapy: 1. left lumpectomy, 06/18/22 and 08/20/22 2. XRT from 10/14/22  through 11/11/22  Current Therapy:  anastrozole   HISTORY OF PRESENT ILLNESS:   Oncology History  Ductal carcinoma in situ (DCIS) of left breast  08/20/2022 Initial Diagnosis   Ductal carcinoma in situ (DCIS) of left breast   12/02/2022 Genetic Testing   Negative Common Hereditary Cancers + RNA panel from [facility]. Report date is Dec 02, 2022.  The [facility] Common Hereditary Cancers + RNA Panel includes sequencing, deletion/duplication, and RNA analysis of the  following 48 genes: APC, ATM, AXIN2, BAP1, BARD1, BMPR1A, BRCA1, BRCA2, BRIP1, CDH1, CDK4*, CDKN2A*, CHEK2, CTNNA1, DICER1, EPCAM* (del/dup only), FH, GREM1* (promoter dup analysis only), HOXB13*, KIT*, MBD4*, MEN1, MLH1, MSH2, MSH3, MSH6, MUTYH, NF1, NTHL1, PALB2, PDGFRA*, PMS2, POLD1, POLE, PTEN, RAD51C, RAD51D, SDHA (sequencing only), SDHB, SDHC, SDHD, SMAD4, SMARCA4, STK11, TP53, TSC1, TSC2, VHL.  *Genes without RNA analysis.      INTERVAL HISTORY:   Amanda Riggs is a 78 y.o. female presenting to clinic today for follow up of Left breast cancer. She was last seen by me on 12/02/22.  Today, she states that she is doing well overall. Her appetite level is at 100%. Her energy level is at 100%. Amanda Riggs states she takes 2,000 units of Vitamin D daily. She has not taken Anastrozole due to the side effects. She notes worsening arthritis, worse on the right side of the body than the left.   Amanda Riggs states she had a mammogram on 10/12/23 that found: Stable lumpectomy changes in the left breast. No mammographic evidence of malignancy in either breast.   PAST MEDICAL HISTORY:   Past Medical History: Past Medical History:  Diagnosis Date   Anxiety    Chronic kidney disease    Depression    GERD (gastroesophageal reflux disease)    HOH (hard of hearing)    Hyperlipidemia    Hypertension    OA (osteoarthritis)    Subconjunctival hemorrhage     Surgical History: Past Surgical History:  Procedure Laterality Date   ABDOMINAL HYSTERECTOMY     BREAST BIOPSY  06/17/2022   MM LT RADIO FREQUENCY TAG EA ADD LESION LOC MAMMO GUIDE 06/17/2022 [facility]   BREAST LUMPECTOMY WITH RADIOFREQUENCY TAG IDENTIFICATION Left 06/18/2022   Procedure: BREAST LUMPECTOMY WITH RADIOFREQUENCY TAG IDENTIFICATION;  Surgeon: Franky Macho, MD;  Location: [facility];  Service: General;  Laterality: Left;    Social History: Social History   Socioeconomic History   Marital status: Married    Spouse name: Not on file   Number of  children: Not on file   Years of education: Not on file   Highest education level: Not on file  Occupational History   Not on file  Tobacco Use   Smoking status: Never    Passive exposure: Never   Smokeless tobacco: Never  Vaping Use   Vaping status: Never Used  Substance and Sexual Activity   Alcohol use: Not Currently   Drug use: Not Currently   Sexual activity: Yes    Birth control/protection: Post-menopausal  Other Topics Concern   Not on file  Social History Narrative   Not on file   Social Drivers of Health   Financial Resource Strain: Not on file  Food Insecurity: No Food Insecurity (09/03/2022)   Hunger Vital Sign    Worried About Running Out of Food in the Last Year: Never true    Ran Out of Food in the Last Year: Never true  Transportation Needs: No Transportation Needs (09/03/2022)   PRAPARE - Transportation    Lack of Transportation (  Medical): No    Lack of Transportation (Non-Medical): No  Physical Activity: Not on file  Stress: Not on file  Social Connections: Not on file  Intimate Partner Violence: Not At Risk (09/03/2022)   Humiliation, Afraid, Rape, and Kick questionnaire    Fear of Current or Ex-Partner: No    Emotionally Abused: No    Physically Abused: No    Sexually Abused: No    Family History: Family History  Problem Relation Age of Onset   Diabetes Mother    Heart disease Mother    Heart disease Father    Alcohol abuse Father    Stroke Father    Prostate cancer Brother    Prostate cancer Brother    Prostate cancer Brother    Colon cancer Maternal Aunt    Colon cancer Paternal Aunt    Colon cancer Cousin    Breast cancer Neg Hx     Current Medications:  Current Outpatient Medications:    anastrozole (ARIMIDEX) 1 MG tablet, Take 1 tablet (1 mg total) by mouth daily., Disp: 90 tablet, Rfl: 1   aspirin EC 81 MG tablet, Take 81 mg by mouth every other day. Swallow whole., Disp: , Rfl:    busPIRone (BUSPAR) 5 MG tablet, Take 5 mg by  mouth daily as needed (anxiety)., Disp: , Rfl:    cyanocobalamin (VITAMIN B12) 1000 MCG tablet, Take 1,000 mcg by mouth daily., Disp: , Rfl:    ergocalciferol (VITAMIN D2) 1.25 MG (50000 UT) capsule, Take 1 capsule (50,000 Units total) by mouth once a week., Disp: 12 capsule, Rfl: 0   Multiple Vitamin (MULTIVITAMIN WITH MINERALS) TABS tablet, Take 1 tablet by mouth daily., Disp: , Rfl:    pravastatin (PRAVACHOL) 40 MG tablet, Take 40 mg by mouth once a week., Disp: , Rfl:    triamterene-hydrochlorothiazide (MAXZIDE-25) 37.5-25 MG tablet, Take 1 tablet by mouth daily., Disp: , Rfl:    Allergies: No Known Allergies  REVIEW OF SYSTEMS:   Review of Systems  Constitutional:  Negative for chills, fatigue and fever.  HENT:   Negative for lump/mass, mouth sores, nosebleeds, sore throat and trouble swallowing.   Eyes:  Negative for eye problems.  Respiratory:  Negative for cough and shortness of breath.   Cardiovascular:  Negative for chest pain, leg swelling and palpitations.  Gastrointestinal:  Negative for abdominal pain, constipation, diarrhea, nausea and vomiting.  Genitourinary:  Negative for bladder incontinence, difficulty urinating, dysuria, frequency, hematuria and nocturia.   Musculoskeletal:  Negative for arthralgias, back pain, flank pain, myalgias and neck pain.  Skin:  Negative for itching and rash.  Neurological:  Negative for dizziness, headaches and numbness.  Hematological:  Does not bruise/bleed easily.  Psychiatric/Behavioral:  Positive for depression. Negative for sleep disturbance and suicidal ideas. The patient is nervous/anxious.   All other systems reviewed and are negative.    VITALS:   Blood pressure (!) 146/75, pulse 71, temperature 97.9 F (36.6 C), temperature source Tympanic, resp. rate 18, height 5' (1.524 m), weight 124 lb (56.2 kg), SpO2 95%.  Wt Readings from Last 3 Encounters:  10/27/23 124 lb (56.2 kg)  12/02/22 116 lb 12.8 oz (53 kg)  09/23/22 118 lb  (53.5 kg)    Body mass index is 24.22 kg/m.  Performance status (ECOG): 1 - Symptomatic but completely ambulatory  PHYSICAL EXAM:   Physical Exam Vitals and nursing note reviewed. Exam conducted with a chaperone present.  Constitutional:      Appearance: Normal appearance.  Cardiovascular:  Rate and Rhythm: Normal rate and regular rhythm.     Pulses: Normal pulses.     Heart sounds: Normal heart sounds.  Pulmonary:     Effort: Pulmonary effort is normal.     Breath sounds: Normal breath sounds.  Chest:  Breasts:    Left: Inverted nipple present.     Comments: +left breast lumpectomy scar in upper outer quadrant with left nipple inverted +no adenopathy +no masses palpable Abdominal:     Palpations: Abdomen is soft. There is no hepatomegaly, splenomegaly or mass.     Tenderness: There is no abdominal tenderness.  Musculoskeletal:     Right lower leg: No edema.     Left lower leg: No edema.  Lymphadenopathy:     Cervical: No cervical adenopathy.     Right cervical: No superficial, deep or posterior cervical adenopathy.    Left cervical: No superficial, deep or posterior cervical adenopathy.     Upper Body:     Right upper body: No supraclavicular or axillary adenopathy.     Left upper body: No supraclavicular or axillary adenopathy.  Neurological:     General: No focal deficit present.     Mental Status: She is alert and oriented to person, place, and time.  Psychiatric:        Mood and Affect: Mood normal.        Behavior: Behavior normal.   Breast Exam Chaperone: Donzell Gallery, RN   LABS:      Latest Ref Rng & Units 10/27/2023    9:09 AM 11/20/2022   10:54 AM 06/16/2022   11:17 AM  CBC  WBC 4.0 - 10.5 K/uL 5.2  4.7  5.9   Hemoglobin 12.0 - 15.0 g/dL 16.1  09.6  04.5   Hematocrit 36.0 - 46.0 % 40.4  41.9  37.8   Platelets 150 - 400 K/uL 188  169  189       Latest Ref Rng & Units 10/27/2023    9:09 AM 11/20/2022   10:54 AM 06/16/2022   11:17 AM  CMP   Glucose 70 - 99 mg/dL 409  97  89   BUN 8 - 23 mg/dL 18  14  17    Creatinine 0.44 - 1.00 mg/dL 8.11  9.14  7.82   Sodium 135 - 145 mmol/L 138  137  137   Potassium 3.5 - 5.1 mmol/L 3.9  4.0  3.8   Chloride 98 - 111 mmol/L 102  100  105   CO2 22 - 32 mmol/L 27  28  25    Calcium 8.9 - 10.3 mg/dL 9.5  9.5  9.2   Total Protein 6.5 - 8.1 g/dL 7.2  7.6  7.0   Total Bilirubin 0.0 - 1.2 mg/dL 1.0  1.0  0.8   Alkaline Phos 38 - 126 U/L 59  68  52   AST 15 - 41 U/L 19  20  20    ALT 0 - 44 U/L 14  17  16       No results found for: "CEA1", "CEA" / No results found for: "CEA1", "CEA" No results found for: "PSA1" No results found for: "NFA213" No results found for: "CAN125"  No results found for: "TOTALPROTELP", "ALBUMINELP", "A1GS", "A2GS", "BETS", "BETA2SER", "GAMS", "MSPIKE", "SPEI" No results found for: "TIBC", "FERRITIN", "IRONPCTSAT" No results found for: "LDH"   STUDIES:   No results found.

## 2023-10-27 NOTE — Patient Instructions (Signed)
 [facility] at [facility] Discharge Instructions   You were seen and examined today by Dr. Ellin Saba.  He reviewed the results of your lab work which are normal/stable.   We will see you back in one year. We will repeat lab work prior to this visit.    Return as scheduled.    Thank you for choosing [facility] at [facility] to provide your oncology and hematology care.  To afford each patient quality time with our provider, please arrive at least 15 minutes before your scheduled appointment time.   If you have a lab appointment with the Cancer Center please come in thru the Main Entrance and check in at the main information desk.  You need to re-schedule your appointment should you arrive 10 or more minutes late.  We strive to give you quality time with our providers, and arriving late affects you and other patients whose appointments are after yours.  Also, if you no show three or more times for appointments you may be dismissed from the clinic at the providers discretion.     Again, thank you for choosing [facility].  Our hope is that these requests will decrease the amount of time that you wait before being seen by our physicians.       _____________________________________________________________  Should you have questions after your visit to [facility], please contact our office at [phone] and follow the prompts.  Our office hours are 8:00 a.m. and 4:30 p.m. Monday - Friday.  Please note that voicemails left after 4:00 p.m. may not be returned until the following business day.  We are closed weekends and major holidays.  You do have access to a nurse 24-7, just call the main number to the clinic [phone] and do not press any options, hold on the line and a nurse will answer the phone.    For prescription refill requests, have your pharmacy contact our office and allow 72 hours.    Due to Covid, you  will need to wear a mask upon entering the hospital. If you do not have a mask, a mask will be given to you at the Main Entrance upon arrival. For doctor visits, patients may have 1 support person age 57 or older with them. For treatment visits, patients can not have anyone with them due to social distancing guidelines and our immunocompromised population.

## 2023-12-28 DIAGNOSIS — I10 Essential (primary) hypertension: Secondary | ICD-10-CM | POA: Diagnosis not present

## 2023-12-28 DIAGNOSIS — R52 Pain, unspecified: Secondary | ICD-10-CM | POA: Diagnosis not present

## 2023-12-28 DIAGNOSIS — M25551 Pain in right hip: Secondary | ICD-10-CM | POA: Diagnosis not present

## 2023-12-28 DIAGNOSIS — Z299 Encounter for prophylactic measures, unspecified: Secondary | ICD-10-CM | POA: Diagnosis not present

## 2024-01-29 DIAGNOSIS — Z1331 Encounter for screening for depression: Secondary | ICD-10-CM | POA: Diagnosis not present

## 2024-01-29 DIAGNOSIS — Z79899 Other long term (current) drug therapy: Secondary | ICD-10-CM | POA: Diagnosis not present

## 2024-01-29 DIAGNOSIS — E7800 Pure hypercholesterolemia, unspecified: Secondary | ICD-10-CM | POA: Diagnosis not present

## 2024-01-29 DIAGNOSIS — Z0000 Encounter for general adult medical examination without abnormal findings: Secondary | ICD-10-CM | POA: Diagnosis not present

## 2024-01-29 DIAGNOSIS — Z7189 Other specified counseling: Secondary | ICD-10-CM | POA: Diagnosis not present

## 2024-01-29 DIAGNOSIS — R5383 Other fatigue: Secondary | ICD-10-CM | POA: Diagnosis not present

## 2024-01-29 DIAGNOSIS — Z1339 Encounter for screening examination for other mental health and behavioral disorders: Secondary | ICD-10-CM | POA: Diagnosis not present

## 2024-01-29 DIAGNOSIS — I10 Essential (primary) hypertension: Secondary | ICD-10-CM | POA: Diagnosis not present

## 2024-01-29 DIAGNOSIS — Z299 Encounter for prophylactic measures, unspecified: Secondary | ICD-10-CM | POA: Diagnosis not present

## 2024-02-16 DIAGNOSIS — E8940 Asymptomatic postprocedural ovarian failure: Secondary | ICD-10-CM | POA: Diagnosis not present

## 2024-03-11 DIAGNOSIS — M8580 Other specified disorders of bone density and structure, unspecified site: Secondary | ICD-10-CM | POA: Diagnosis not present

## 2024-03-11 DIAGNOSIS — R531 Weakness: Secondary | ICD-10-CM | POA: Diagnosis not present

## 2024-03-11 DIAGNOSIS — Z299 Encounter for prophylactic measures, unspecified: Secondary | ICD-10-CM | POA: Diagnosis not present

## 2024-03-11 DIAGNOSIS — I10 Essential (primary) hypertension: Secondary | ICD-10-CM | POA: Diagnosis not present

## 2024-04-22 DIAGNOSIS — R52 Pain, unspecified: Secondary | ICD-10-CM | POA: Diagnosis not present

## 2024-04-22 DIAGNOSIS — B029 Zoster without complications: Secondary | ICD-10-CM | POA: Diagnosis not present

## 2024-04-22 DIAGNOSIS — Z299 Encounter for prophylactic measures, unspecified: Secondary | ICD-10-CM | POA: Diagnosis not present

## 2024-04-22 DIAGNOSIS — R21 Rash and other nonspecific skin eruption: Secondary | ICD-10-CM | POA: Diagnosis not present

## 2024-04-22 DIAGNOSIS — I10 Essential (primary) hypertension: Secondary | ICD-10-CM | POA: Diagnosis not present

## 2024-05-02 DIAGNOSIS — Z853 Personal history of malignant neoplasm of breast: Secondary | ICD-10-CM | POA: Diagnosis not present

## 2024-05-02 DIAGNOSIS — F419 Anxiety disorder, unspecified: Secondary | ICD-10-CM | POA: Diagnosis not present

## 2024-05-02 DIAGNOSIS — Z299 Encounter for prophylactic measures, unspecified: Secondary | ICD-10-CM | POA: Diagnosis not present

## 2024-05-02 DIAGNOSIS — I10 Essential (primary) hypertension: Secondary | ICD-10-CM | POA: Diagnosis not present

## 2024-05-02 DIAGNOSIS — E7800 Pure hypercholesterolemia, unspecified: Secondary | ICD-10-CM | POA: Diagnosis not present

## 2024-05-02 DIAGNOSIS — D0512 Intraductal carcinoma in situ of left breast: Secondary | ICD-10-CM | POA: Diagnosis not present

## 2024-05-02 DIAGNOSIS — Z08 Encounter for follow-up examination after completed treatment for malignant neoplasm: Secondary | ICD-10-CM | POA: Diagnosis not present

## 2024-05-05 DIAGNOSIS — D0512 Intraductal carcinoma in situ of left breast: Secondary | ICD-10-CM | POA: Diagnosis not present

## 2024-10-24 ENCOUNTER — Other Ambulatory Visit

## 2024-10-31 ENCOUNTER — Ambulatory Visit: Admitting: Physician Assistant

## 2024-10-31 ENCOUNTER — Ambulatory Visit: Admitting: Hematology
